# Patient Record
Sex: Female | Born: 1980 | Race: Black or African American | Hispanic: No | Marital: Single | Smoking: Never smoker
Health system: Southern US, Community
[De-identification: ages and names within clinical notes are randomized; demographics above are authoritative.]

## PROBLEM LIST (undated history)

## (undated) DIAGNOSIS — I10 Essential (primary) hypertension: Secondary | ICD-10-CM

## (undated) DIAGNOSIS — R011 Cardiac murmur, unspecified: Secondary | ICD-10-CM

## (undated) DIAGNOSIS — D219 Benign neoplasm of connective and other soft tissue, unspecified: Secondary | ICD-10-CM

## (undated) HISTORY — PX: NO PAST SURGERIES: SHX2092

## (undated) HISTORY — DX: Morbid (severe) obesity due to excess calories: E66.01

## (undated) HISTORY — DX: Cardiac murmur, unspecified: R01.1

## (undated) HISTORY — DX: Essential (primary) hypertension: I10

## (undated) HISTORY — DX: Benign neoplasm of connective and other soft tissue, unspecified: D21.9

---

## 2019-10-22 ENCOUNTER — Other Ambulatory Visit: Payer: Self-pay

## 2019-10-22 ENCOUNTER — Encounter: Payer: Self-pay | Admitting: Nurse Practitioner

## 2019-10-22 ENCOUNTER — Ambulatory Visit: Payer: 59 | Admitting: Nurse Practitioner

## 2019-10-22 VITALS — BP 142/100 | HR 86 | Temp 97.3°F | Ht 65.0 in | Wt 276.0 lb

## 2019-10-22 DIAGNOSIS — Z0001 Encounter for general adult medical examination with abnormal findings: Secondary | ICD-10-CM | POA: Diagnosis not present

## 2019-10-22 DIAGNOSIS — I10 Essential (primary) hypertension: Secondary | ICD-10-CM | POA: Diagnosis not present

## 2019-10-22 DIAGNOSIS — F339 Major depressive disorder, recurrent, unspecified: Secondary | ICD-10-CM | POA: Insufficient documentation

## 2019-10-22 MED ORDER — CITALOPRAM HYDROBROMIDE 10 MG PO TABS: 10.0000 mg | ORAL_TABLET | Freq: Every day | ORAL | 0 refills | Status: DC

## 2019-10-22 MED ORDER — METOPROLOL-HYDROCHLOROTHIAZIDE 50-25 MG PO TABS: 1.0000 | ORAL_TABLET | Freq: Every day | ORAL | 0 refills | Status: DC

## 2019-10-22 NOTE — Patient Instructions (Signed)
Preventive Care 49-38 Years Old, Female Preventive care refers to visits with your health care provider and lifestyle choices that can promote health and wellness. This includes:  A yearly physical exam. This may also be called an annual well check.  Regular dental visits and eye exams.  Immunizations.  Screening for certain conditions.  Healthy lifestyle choices, such as eating a healthy diet, getting regular exercise, not using drugs or products that contain nicotine and tobacco, and limiting alcohol use. What can I expect for my preventive care visit? Physical exam Your health care provider will check your:  Height and weight. This may be used to calculate body mass index (BMI), which tells if you are at a healthy weight.  Heart rate and blood pressure.  Skin for abnormal spots. Counseling Your health care provider may ask you questions about your:  Alcohol, tobacco, and drug use.  Emotional well-being.  Home and relationship well-being.  Sexual activity.  Eating habits.  Work and work Statistician.  Method of birth control.  Menstrual cycle.  Pregnancy history. What immunizations do I need?  Influenza (flu) vaccine  This is recommended every year. Tetanus, diphtheria, and pertussis (Tdap) vaccine  You may need a Td booster every 10 years. Varicella (chickenpox) vaccine  You may need this if you have not been vaccinated. Human papillomavirus (HPV) vaccine  If recommended by your health care provider, you may need three doses over 6 months. Measles, mumps, and rubella (MMR) vaccine  You may need at least one dose of MMR. You may also need a second dose. Meningococcal conjugate (MenACWY) vaccine  One dose is recommended if you are age 75-21 years and a first-year college student living in a residence hall, or if you have one of several medical conditions. You may also need additional booster doses. Pneumococcal conjugate (PCV13) vaccine  You  may need this if you have certain conditions and were not previously vaccinated. Pneumococcal polysaccharide (PPSV23) vaccine  You may need one or two doses if you smoke cigarettes or if you have certain conditions. Hepatitis A vaccine  You may need this if you have certain conditions or if you travel or work in places where you may be exposed to hepatitis A. Hepatitis B vaccine  You may need this if you have certain conditions or if you travel or work in places where you may be exposed to hepatitis B. Haemophilus influenzae type b (Hib) vaccine  You may need this if you have certain conditions. You may receive vaccines as individual doses or as more than one vaccine together in one shot (combination vaccines). Talk with your health care provider about the risks and benefits of combination vaccines. What tests do I need?  Blood tests  Lipid and cholesterol levels. These may be checked every 5 years starting at age 68.  Hepatitis C test.  Hepatitis B test. Screening  Diabetes screening. This is done by checking your blood sugar (glucose) after you have not eaten for a while (fasting).  Sexually transmitted disease (STD) testing.  BRCA-related cancer screening. This may be done if you have a family history of breast, ovarian, tubal, or peritoneal cancers.  Pelvic exam and Pap test. This may be done every 3 years starting at age 90. Starting at age 24, this may be done every 5 years if you have a Pap test in combination with an HPV test. Talk with your health care provider about your test results, treatment options, and if necessary,  the need for more tests. Follow these instructions at home: Eating and drinking   Eat a diet that includes fresh fruits and vegetables, whole grains, lean protein, and low-fat dairy.  Take vitamin and mineral supplements as recommended by your health care provider.  Do not drink alcohol if: ? Your health care provider tells you not to drink. ? You  are pregnant, may be pregnant, or are planning to become pregnant.  If you drink alcohol: ? Limit how much you have to 0-1 drink a day. ? Be aware of how much alcohol is in your drink. In the [place] one drink equals one 12 oz bottle of beer (355 mL), one 5 oz glass of wine (148 mL), or one 1 oz glass of hard liquor (44 mL). Lifestyle  Take daily care of your teeth and gums.  Stay active. Exercise for at least 30 minutes on 5 or more days each week.  Do not use any products that contain nicotine or tobacco, such as cigarettes, e-cigarettes, and chewing tobacco. If you need help quitting, ask your health care provider.  If you are sexually active, practice safe sex. Use a condom or other form of birth control (contraception) in order to prevent pregnancy and STIs (sexually transmitted infections). If you plan to become pregnant, see your health care provider for a preconception visit. What's next?  Visit your health care provider once a year for a well check visit.  Ask your health care provider how often you should have your eyes and teeth checked.  Stay up to date on all vaccines. This information is not intended to replace advice given to you by your health care provider. Make sure you discuss any questions you have with your health care provider. Document Revised: 04/03/2018 Document Reviewed: 04/03/2018 Elsevier Patient Education  [place]. Hypertension, Adult Hypertension is another name for high blood pressure. High blood pressure forces your heart to work harder to pump blood. This can cause problems over time. There are two numbers in a blood pressure reading. There is a top number (systolic) over a bottom number (diastolic). It is best to have a blood pressure that is below 120/80. Healthy choices can help lower your blood pressure, or you may need medicine to help lower it. What are the causes? The cause of this condition is not known. Some conditions may be related to  high blood pressure. What increases the risk?  Smoking.  Having type 2 diabetes mellitus, high cholesterol, or both.  Not getting enough exercise or physical activity.  Being overweight.  Having too much fat, sugar, calories, or salt (sodium) in your diet.  Drinking too much alcohol.  Having long-term (chronic) kidney disease.  Having a family history of high blood pressure.  Age. Risk increases with age.  Race. You may be at higher risk if you are African American.  Gender. Men are at higher risk than women before age 38. After age 91, women are at higher risk than men.  Having obstructive sleep apnea.  Stress. What are the signs or symptoms?  High blood pressure may not cause symptoms. Very high blood pressure (hypertensive crisis) may cause: ? Headache. ? Feelings of worry or nervousness (anxiety). ? Shortness of breath. ? Nosebleed. ? A feeling of being sick to your stomach (nausea). ? Throwing up (vomiting). ? Changes in how you see. ? Very bad chest pain. ? Seizures. How is this treated?  This condition is treated by making healthy lifestyle changes, such as: ?  Eating healthy foods. ? Exercising more. ? Drinking less alcohol.  Your health care provider may prescribe medicine if lifestyle changes are not enough to get your blood pressure under control, and if: ? Your top number is above 130. ? Your bottom number is above 80.  Your personal target blood pressure may vary. Follow these instructions at home: Eating and drinking   If told, follow the DASH eating plan. To follow this plan: ? Fill one half of your plate at each meal with fruits and vegetables. ? Fill one fourth of your plate at each meal with whole grains. Whole grains include whole-wheat pasta, brown rice, and whole-grain bread. ? Eat or drink low-fat dairy products, such as skim milk or low-fat yogurt. ? Fill one fourth of your plate at each meal with low-fat (lean) proteins. Low-fat  proteins include fish, chicken without skin, eggs, beans, and tofu. ? Avoid fatty meat, cured and processed meat, or chicken with skin. ? Avoid pre-made or processed food.  Eat less than 1,500 mg of salt each day.  Do not drink alcohol if: ? Your doctor tells you not to drink. ? You are pregnant, may be pregnant, or are planning to become pregnant.  If you drink alcohol: ? Limit how much you use to:  0-1 drink a day for women.  0-2 drinks a day for men. ? Be aware of how much alcohol is in your drink. In the [place] one drink equals one 12 oz bottle of beer (355 mL), one 5 oz glass of wine (148 mL), or one 1 oz glass of hard liquor (44 mL). Lifestyle   Work with your doctor to stay at a healthy weight or to lose weight. Ask your doctor what the best weight is for you.  Get at least 30 minutes of exercise most days of the week. This may include walking, swimming, or biking.  Get at least 30 minutes of exercise that strengthens your muscles (resistance exercise) at least 3 days a week. This may include lifting weights or doing Pilates.  Do not use any products that contain nicotine or tobacco, such as cigarettes, e-cigarettes, and chewing tobacco. If you need help quitting, ask your doctor.  Check your blood pressure at home as told by your doctor.  Keep all follow-up visits as told by your doctor. This is important. Medicines  Take over-the-counter and prescription medicines only as told by your doctor. Follow directions carefully.  Do not skip doses of blood pressure medicine. The medicine does not work as well if you skip doses. Skipping doses also puts you at risk for problems.  Ask your doctor about side effects or reactions to medicines that you should watch for. Contact a doctor if you:  Think you are having a reaction to the medicine you are taking.  Have headaches that keep coming back (recurring).  Feel dizzy.  Have swelling in your ankles.  Have trouble with  your vision. Get help right away if you:  Get a very bad headache.  Start to feel mixed up (confused).  Feel weak or numb.  Feel faint.  Have very bad pain in your: ? Chest. ? Belly (abdomen).  Throw up more than once.  Have trouble breathing. Summary  Hypertension is another name for high blood pressure.  High blood pressure forces your heart to work harder to pump blood.  For most people, a normal blood pressure is less than 120/80.  Making healthy choices can help lower blood pressure. If  your blood pressure does not get lower with healthy choices, you may need to take medicine. This information is not intended to replace advice given to you by your health care provider. Make sure you discuss any questions you have with your health care provider. Document Revised: 04/02/2018 Document Reviewed: 04/02/2018 Elsevier Patient Education  2020 [employer].

## 2019-10-22 NOTE — Progress Notes (Signed)
Established Patient Office Visit  Subjective:  Patient ID: Rebekah Barnes, female    DOB: Dec 31, 1980  Age: 39 y.o. MRN: [id number]  CC: Patient  is a 39 year  old female. She is here for complete physical.  The patient's medications were reviewed and reconciled since the patient's last visit.  History details were provided by the patient. The history appears to be reliable.    Chief Complaint  Patient presents with  . Annual Exam  . Hypertension    Patient states she has been without medication for a year  . Depression    Patient would like to restart something other  than Celexa due to it causing her to gain weight    HPI Rebekah Barnes presents for complete physical Encounter for general adult medical examination without abnormal findings   Patient's last physical exam was 1 year ago .  Weight: Appropriate for height (BMI 45.93kg/m) ;  Blood Pressure: abnormal (BP greater than 120/80) ;  Medical History: Patient history reviewed ; Family history reviewed ;  Allergies Reviewed: No change in current allergies ;  Medications Reviewed: Medications reviewed - no changes ;  Lipids: Normal lipid levels ;  Smoking: Life-long non-smoker ;  Physical Activity: Exercises at least 2 times per week ;  Alcohol/Drug Use: Is a non-drinker ; No illicit drug use ;  Patient is not afflicted from Stress Incontinence and Urge Incontinence  Safety: reviewed ; Patient wears a seat belt, has smoke detectors, has carbon monoxide detectors, practices appropriate gun safety (not applicable), and wears sunscreen with extended sun exposure. Dental Care: biannual cleanings, brushes and flosses daily. Ophthalmology/Optometry: Annual visit.  Hearing loss: none Vision impairments: none  Past Medical History:  Diagnosis Date  . Essential (primary) hypertension   . Morbid (severe) obesity due to excess calories [facility])     History reviewed. No pertinent surgical history.  Family History  Problem Relation  Age of Onset  . Hypertension Father   . Renal Disease Father     Social History   Socioeconomic History  . Marital status: Single    Spouse name: Not on file  . Number of children: Not on file  . Years of education: Not on file  . Highest education level: Not on file  Occupational History  . Not on file  Tobacco Use  . Smoking status: Never Smoker  . Smokeless tobacco: Never Used  Substance and Sexual Activity  . Alcohol use: Not Currently  . Drug use: Never  . Sexual activity: Not on file  Other Topics Concern  . Not on file  Social History Narrative  . Not on file   Social Determinants of Health   Financial Resource Strain:   . Difficulty of Paying Living Expenses:   Food Insecurity:   . Worried About Charity fundraiser in the Last Year:   . Arboriculturist in the Last Year:   Transportation Needs:   . Film/video editor (Medical):   Marland Kitchen Lack of Transportation (Non-Medical):   Physical Activity:   . Days of Exercise per Week:   . Minutes of Exercise per Session:   Stress:   . Feeling of Stress :   Social Connections:   . Frequency of Communication with Friends and Family:   . Frequency of Social Gatherings with Friends and Family:   . Attends Religious Services:   . Active Member of Clubs or Organizations:   . Attends Archivist Meetings:   Marland Kitchen Marital  Status:   Intimate Partner Violence:   . Fear of Current or Ex-Partner:   . Emotionally Abused:   Marland Kitchen Physically Abused:   . Sexually Abused:     Outpatient Medications Prior to Visit  Medication Sig Dispense Refill  . Multiple Vitamin (MULTIVITAMIN) tablet Take 1 tablet by mouth daily.    . citalopram (CELEXA) 20 MG tablet Take 20 mg by mouth daily.    . metoprolol-hydrochlorothiazide (LOPRESSOR HCT) 50-25 MG tablet Take 1 tablet by mouth daily.    . phentermine (ADIPEX-P) 37.5 MG tablet Take 37.5 mg by mouth daily before breakfast.     No facility-administered medications prior to visit.     Allergies  Allergen Reactions  . Lisinopril Other (See Comments)    Angioedema     ROS Review of Systems  Constitutional: Negative for activity change, appetite change and fatigue.  HENT: Negative for congestion and ear pain.   Eyes: Negative for pain and discharge.  Respiratory: Negative for chest tightness and shortness of breath.   Cardiovascular: Negative for chest pain and palpitations.  Gastrointestinal: Negative for abdominal pain and constipation.  Endocrine: Negative for cold intolerance and heat intolerance.  Genitourinary: Negative for difficulty urinating and dysuria.  Musculoskeletal: Negative for arthralgias and myalgias.  Skin: Negative for color change and rash.  Neurological: Negative for weakness and headaches.  Psychiatric/Behavioral: The patient is not nervous/anxious.       Objective:    Physical Exam  Constitutional: She is oriented to person, place, and time. She appears well-developed and well-nourished.  HENT:  Head: Normocephalic.  Mouth/Throat: Oropharynx is clear and moist.  Eyes: Right eye exhibits no discharge. Left eye exhibits no discharge.  Cardiovascular: Normal rate and regular rhythm.  Pulmonary/Chest: Effort normal and breath sounds normal.  Abdominal: Soft. Bowel sounds are normal.  Musculoskeletal:        General: No edema.     Cervical back: Normal range of motion and neck supple.  Neurological: She is alert and oriented to person, place, and time.  Skin: Skin is warm. No rash noted.  Psychiatric: She has a normal mood and affect. Judgment normal.     BP (!) 142/100 (BP Location: Left Arm, Patient Position: Sitting)   Pulse 86   Temp (!) 97.3 F (36.3 C)   Ht 5' 5"  (1.651 m)   Wt 276 lb (125.2 kg)   SpO2 98%   BMI 45.93 kg/m  Wt Readings from Last 3 Encounters:  10/22/19 276 lb (125.2 kg)       Health Maintenance Due  Topic Date Due  . HIV Screening  Never done  . TETANUS/TDAP  Never done  . PAP SMEAR-Modifier   Never done  . INFLUENZA VACCINE  03/07/2019    There are no preventive care reminders to display for this patient.  No results found for: TSH No results found for: WBC, HGB, HCT, MCV, PLT No results found for: NA, K, CHLORIDE, CO2, GLUCOSE, BUN, CREATININE, BILITOT, ALKPHOS, AST, ALT, PROT, ALBUMIN, CALCIUM, ANIONGAP, EGFR, GFR No results found for: CHOL No results found for: HDL No results found for: LDLCALC No results found for: TRIG No results found for: CHOLHDL No results found for: HGBA1C    Assessment & Plan:  Depression, recurrent ([place]) Not well controlled.  Started back on medication.    Annual visit for general adult medical examination with abnormal findings Well controlled.  No changes to medicines.  Continue to work on eating a healthy diet and exercise.  Labs  drawn today.   Essential hypertension Not well controlled.  Changes to medication, Rx sent to pharmacy. Continue to work on eating a low sodium diet. and exercise.    Problem List Items Addressed This Visit      Cardiovascular and Mediastinum   Essential hypertension    Well controlled.  No changes to medicines.  Continue to work on eating a healthy diet and exercise.  Labs drawn today.       Relevant Medications   metoprolol-hydrochlorothiazide (LOPRESSOR HCT) 50-25 MG tablet     Other   Annual visit for general adult medical examination with abnormal findings - Primary    Well controlled.  No changes to medicines.  Continue to work on eating a healthy diet and exercise.  Labs drawn today.       Relevant Orders   CBC with Differential/Platelet   Comprehensive metabolic panel   ATV,VLRT,JWWZLYTSS,QSY71/58,06   Depression, recurrent ([place])    Well controlled.  No changes to medicines.  Continue to work on eating a healthy diet and exercise.  Labs drawn today.       Relevant Medications   citalopram (CELEXA) 10 MG tablet      Meds ordered this encounter  Medications  .  metoprolol-hydrochlorothiazide (LOPRESSOR HCT) 50-25 MG tablet    Sig: Take 1 tablet by mouth daily.    Dispense:  30 tablet    Refill:  0    Order Specific Question:   Supervising Provider    Answer:   Bo Merino [[id number]]  . citalopram (CELEXA) 10 MG tablet    Sig: Take 1 tablet (10 mg total) by mouth daily.    Dispense:  45 tablet    Refill:  0    Order Specific Question:   Supervising Provider    Answer:   Bo Merino [[id number]]    Follow-up: Return in about 2 weeks (around 11/05/2019) for blood pressure follow up.    Ivy Lynn, NP

## 2019-10-22 NOTE — Assessment & Plan Note (Signed)
Well controlled.  ?No changes to medicines.  ?Continue to work on eating a healthy diet and exercise.  ?Labs drawn today.  ?

## 2019-10-22 NOTE — Assessment & Plan Note (Addendum)
Not well controlled.  Changes to medication, Rx sent to pharmacy. Continue to work on eating a low sodium diet. and exercise.

## 2019-10-22 NOTE — Assessment & Plan Note (Addendum)
Not well controlled.  Started back on medication.

## 2019-10-23 LAB — COMPREHENSIVE METABOLIC PANEL
ALT: 13 IU/L (ref 0–32)
AST: 20 IU/L (ref 0–40)
Albumin/Globulin Ratio: 1.2 (ref 1.2–2.2)
Albumin: 4.1 g/dL (ref 3.8–4.8)
Alkaline Phosphatase: 105 IU/L (ref 39–117)
BUN/Creatinine Ratio: 15 (ref 9–23)
BUN: 15 mg/dL (ref 6–20)
Bilirubin Total: <0.2 mg/dL (ref 0.0–1.2)
CO2: 23 mmol/L (ref 20–29)
Calcium: 9.3 mg/dL (ref 8.7–10.2)
Chloride: 104 mmol/L (ref 96–106)
Creatinine, Ser: 0.97 mg/dL (ref 0.57–1.00)
GFR calc Af Amer: 86 mL/min/{1.73_m2} (ref >59)
GFR calc non Af Amer: 75 mL/min/{1.73_m2} (ref >59)
Globulin, Total: 3.4 g/dL (ref 1.5–4.5)
Glucose: 92 mg/dL (ref 65–99)
Potassium: 4.9 mmol/L (ref 3.5–5.2)
Sodium: 141 mmol/L (ref 134–144)
Total Protein: 7.5 g/dL (ref 6.0–8.5)

## 2019-10-23 LAB — CBC WITH DIFFERENTIAL/PLATELET
Basophils Absolute: 0.1 10*3/uL (ref 0.0–0.2)
Basos: 1 %
EOS (ABSOLUTE): 0.4 10*3/uL (ref 0.0–0.4)
Eos: 3 %
Hematocrit: 36.4 % (ref 34.0–46.6)
Hemoglobin: 11.7 g/dL (ref 11.1–15.9)
Immature Grans (Abs): 0 10*3/uL (ref 0.0–0.1)
Immature Granulocytes: 0 %
Lymphocytes Absolute: 2.8 10*3/uL (ref 0.7–3.1)
Lymphs: 22 %
MCH: 27.1 pg (ref 26.6–33.0)
MCHC: 32.1 g/dL (ref 31.5–35.7)
MCV: 85 fL (ref 79–97)
Monocytes Absolute: 1 10*3/uL — ABNORMAL HIGH (ref 0.1–0.9)
Monocytes: 8 %
Neutrophils Absolute: 8.5 10*3/uL — ABNORMAL HIGH (ref 1.4–7.0)
Neutrophils: 66 %
Platelets: 415 10*3/uL (ref 150–450)
RBC: 4.31 x10E6/uL (ref 3.77–5.28)
RDW: 13.7 % (ref 11.7–15.4)
WBC: 12.8 10*3/uL — ABNORMAL HIGH (ref 3.4–10.8)

## 2019-10-28 LAB — IGP,CTNG,APTIMAHPV,RFX16/18,45
Chlamydia, Nuc. Acid Amp: NEGATIVE
Gonococcus by Nucleic Acid Amp: NEGATIVE
HPV Aptima: NEGATIVE
PAP Smear Comment: 0

## 2019-11-05 ENCOUNTER — Ambulatory Visit: Payer: 59 | Admitting: Nurse Practitioner

## 2019-11-05 ENCOUNTER — Other Ambulatory Visit: Payer: Self-pay

## 2019-11-05 ENCOUNTER — Encounter: Payer: Self-pay | Admitting: Nurse Practitioner

## 2019-11-05 VITALS — BP 138/90 | HR 72 | Temp 97.2°F | Ht 65.0 in | Wt 276.0 lb

## 2019-11-05 DIAGNOSIS — I10 Essential (primary) hypertension: Secondary | ICD-10-CM | POA: Diagnosis not present

## 2019-11-05 DIAGNOSIS — F339 Major depressive disorder, recurrent, unspecified: Secondary | ICD-10-CM

## 2019-11-05 NOTE — Assessment & Plan Note (Signed)
No changes to medication. Blood pressure well managed on current dose. Continue to monitor blood pressure at home and follow up in one month.

## 2019-11-05 NOTE — Progress Notes (Addendum)
Established Patient Office Visit  Subjective:  Patient ID: Rebekah Barnes, female    DOB: 10-10-1980  Age: 39 y.o. MRN: [id number]  CC: Patient  is a 39 year old female. She is here for hypertension.  The patient's medications were reviewed and reconciled since the patient's last visit.  History details were provided by the patient. The history appears to be reliable.   Chief Complaint  Patient presents with  . Hypertension    2 week follow up    HPI Rebekah Barnes presents for Pt presents for follow up of hypertension. Patient was diagnosed in 2018. Current medication lopressor HCT 50-25 The patient is tolerating the medication well without side effects. Compliance with treatment has been good; including taking medication as directed , maintains a healthy diet and regular exercise regimen , and following up as directed.  concerning patients depression, she is well managed on current medication. Patient deinies any suicidal thought of harming self and others.  Past Medical History:  Diagnosis Date  . Essential (primary) hypertension   . Morbid (severe) obesity due to excess calories [facility])     History reviewed. No pertinent surgical history.  Family History  Problem Relation Age of Onset  . Hypertension Father   . Renal Disease Father     Social History   Socioeconomic History  . Marital status: Single    Spouse name: Not on file  . Number of children: Not on file  . Years of education: Not on file  . Highest education level: Not on file  Occupational History  . Not on file  Tobacco Use  . Smoking status: Never Smoker  . Smokeless tobacco: Never Used  Substance and Sexual Activity  . Alcohol use: Not Currently  . Drug use: Never  . Sexual activity: Not on file  Other Topics Concern  . Not on file  Social History Narrative  . Not on file   Social Determinants of Health   Financial Resource Strain:   . Difficulty of Paying Living Expenses:   Food Insecurity:     . Worried About Charity fundraiser in the Last Year:   . Arboriculturist in the Last Year:   Transportation Needs:   . Film/video editor (Medical):   Marland Kitchen Lack of Transportation (Non-Medical):   Physical Activity:   . Days of Exercise per Week:   . Minutes of Exercise per Session:   Stress:   . Feeling of Stress :   Social Connections:   . Frequency of Communication with Friends and Family:   . Frequency of Social Gatherings with Friends and Family:   . Attends Religious Services:   . Active Member of Clubs or Organizations:   . Attends Archivist Meetings:   Marland Kitchen Marital Status:   Intimate Partner Violence:   . Fear of Current or Ex-Partner:   . Emotionally Abused:   Marland Kitchen Physically Abused:   . Sexually Abused:     Outpatient Medications Prior to Visit  Medication Sig Dispense Refill  . citalopram (CELEXA) 10 MG tablet Take 1 tablet (10 mg total) by mouth daily. 45 tablet 0  . metoprolol-hydrochlorothiazide (LOPRESSOR HCT) 50-25 MG tablet Take 1 tablet by mouth daily. 30 tablet 0  . Multiple Vitamin (MULTIVITAMIN) tablet Take 1 tablet by mouth daily.     No facility-administered medications prior to visit.    Allergies  Allergen Reactions  . Lisinopril Other (See Comments)    Angioedema  ROS Review of Systems  Eyes: Negative for redness.  Respiratory: Negative for chest tightness and shortness of breath.   Cardiovascular: Negative for chest pain and palpitations.  Endocrine: Negative for cold intolerance and heat intolerance.  Genitourinary: Negative for difficulty urinating.  Musculoskeletal: Negative for arthralgias and myalgias.  Skin: Negative for color change and rash.  Neurological: Negative for weakness and light-headedness.  Psychiatric/Behavioral: The patient is not nervous/anxious.       Objective:    Physical Exam  Constitutional: She is oriented to person, place, and time. She appears well-developed and well-nourished.  HENT:   Mouth/Throat: Oropharynx is clear and moist.  Eyes: Conjunctivae are normal.  Cardiovascular: Normal rate, regular rhythm and normal heart sounds.  Pulmonary/Chest: Effort normal and breath sounds normal.  Abdominal: Soft. Bowel sounds are normal.  Genitourinary:    Vaginal discharge present.   Musculoskeletal:        General: No tenderness.     Cervical back: Neck supple.  Neurological: She is alert and oriented to person, place, and time.  Skin: Skin is warm. No rash noted.  Psychiatric: She has a normal mood and affect.    BP 138/90 (BP Location: Left Arm, Patient Position: Sitting)   Pulse 72   Temp (!) 97.2 F (36.2 C) (Temporal)   Ht 5\' 5"  (1.651 m)   Wt 276 lb (125.2 kg)   SpO2 99%   BMI 45.93 kg/m  Wt Readings from Last 3 Encounters:  11/05/19 276 lb (125.2 kg)  10/22/19 276 lb (125.2 kg)     Health Maintenance Due  Topic Date Due  . HIV Screening  Never done  . TETANUS/TDAP  Never done    There are no preventive care reminders to display for this patient.  No results found for: TSH Lab Results  Component Value Date   WBC 12.8 (H) 10/22/2019   HGB 11.7 10/22/2019   HCT 36.4 10/22/2019   MCV 85 10/22/2019   PLT 415 10/22/2019   Lab Results  Component Value Date   NA 141 10/22/2019   K 4.9 10/22/2019   CO2 23 10/22/2019   GLUCOSE 92 10/22/2019   BUN 15 10/22/2019   CREATININE 0.97 10/22/2019   BILITOT <0.2 10/22/2019   ALKPHOS 105 10/22/2019   AST 20 10/22/2019   ALT 13 10/22/2019   PROT 7.5 10/22/2019   ALBUMIN 4.1 10/22/2019   CALCIUM 9.3 10/22/2019   No results found for: CHOL No results found for: HDL No results found for: LDLCALC No results found for: TRIG No results found for: CHOLHDL No results found for: HGBA1C    Assessment & Plan:  Essential hypertension No changes to medication. Blood pressure well managed on current dose. Continue to monitor blood pressure at home and follow up in one month.  Depression, recurrent  ([place]) Patient is well controlled on current medication. Patient knows to follow up as schedulled.    Problem List Items Addressed This Visit      Cardiovascular and Mediastinum   Essential hypertension     Other   Depression, recurrent ([place]) - Primary      No orders of the defined types were placed in this encounter.   Follow-up: Return in about 1 month (around 12/05/2019) for BP.    Ivy Lynn, NP

## 2019-11-05 NOTE — Assessment & Plan Note (Signed)
Patient is well controlled on current medication. Patient knows to follow up as schedulled.

## 2019-11-05 NOTE — Patient Instructions (Signed)

## 2019-11-08 ENCOUNTER — Other Ambulatory Visit (INDEPENDENT_AMBULATORY_CARE_PROVIDER_SITE_OTHER): Payer: 59 | Admitting: Nurse Practitioner

## 2019-11-08 DIAGNOSIS — A599 Trichomoniasis, unspecified: Secondary | ICD-10-CM

## 2019-11-08 MED ORDER — METRONIDAZOLE 500 MG PO TABS: 500.0000 mg | ORAL_TABLET | Freq: Two times a day (BID) | ORAL | 0 refills | Status: DC

## 2019-11-08 NOTE — Progress Notes (Signed)
Abnormal Labs: Positive Trichomoniasis;  Recommendation: 500 mg (metronidizole) twice daily for 7 days  sent to pharmacy. Patient should avoid alcohol during treatment and 24 hours after medication administration. Avoid sex until patient  and partner have been treated, follow up repeat testing in 2-3 months  patients partner needs  to be treated at the same time.

## 2019-11-09 NOTE — Addendum Note (Signed)
Addended by: Ivy Lynn on: 11/09/2019 08:45 AM   Modules accepted: Level of Service

## 2019-11-10 ENCOUNTER — Other Ambulatory Visit: Payer: Self-pay

## 2019-11-10 DIAGNOSIS — I10 Essential (primary) hypertension: Secondary | ICD-10-CM

## 2019-11-10 MED ORDER — METOPROLOL-HYDROCHLOROTHIAZIDE 50-25 MG PO TABS: 1.0000 | ORAL_TABLET | Freq: Every day | ORAL | 0 refills | Status: DC

## 2019-12-07 ENCOUNTER — Ambulatory Visit: Payer: 59 | Admitting: Nurse Practitioner

## 2019-12-07 NOTE — Progress Notes (Signed)
Subjective:  Patient ID: Rebekah Barnes, female    DOB: 12-15-1980  Age: 39 y.o. MRN: [id number]  Chief Complaint  Patient presents with  . Hypertension    HPI Patient is a 39 year old female who presents for follow-up of depression, hypertension and obesity.  Currently her depression is well controlled on citalopram 20 mg once daily.  Her blood pressure has significantly improved since her initial appointment early March.  She is currently on metoprolol/HCTZ 50-25 mg once daily.  She denies any chest pain, blurry vision, or headaches.  Patient also suffers from morbid obesity.  BMI is 45.  She has intermittently been on Adipex since 2017.  It does not appear that she is ever taken it for the full 6 months.  She has had some success.  However she has difficulty eating healthy and exercising.  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  Drinking a lot of water and has decreased her soda intake significantly.  She is requesting to restart the phentermine.  Patient is complaining of dysmenorrhea.  She has menstrual periods monthly which range from light to heavy.  She has cramping 2 to 3 days before her period starts.  She occasionally takes ibuprofen 400 mg which does seem to help.  Her periods last 4 to 5 days.  Previously when she was on oral birth control pills it did not seem to help the pain.  Currently she uses 1 tampon approximately every 2-3 hours.   Past Medical History:  Diagnosis Date  . Essential (primary) hypertension   . Morbid (severe) obesity due to excess calories [facility])    History reviewed. No pertinent surgical history.  Family History  Problem Relation Age of Onset  . Hypertension Father   . Renal Disease Father    Social History   Socioeconomic History  . Marital status: Single    Spouse name: Not on file  . Number of children: Not on file  . Years of education: Not on file  . Highest education level: Not on file  Occupational  History  . Not on file  Tobacco Use  . Smoking status: Never Smoker  . Smokeless tobacco: Never Used  Substance and Sexual Activity  . Alcohol use: Not Currently  . Drug use: Never  . Sexual activity: Not on file  Other Topics Concern  . Not on file  Social History Narrative  . Not on file   Social Determinants of Health   Financial Resource Strain:   . Difficulty of Paying Living Expenses:   Food Insecurity:   . Worried About Charity fundraiser in the Last Year:   . Arboriculturist in the Last Year:   Transportation Needs:   . Film/video editor (Medical):   Marland Kitchen Lack of Transportation (Non-Medical):   Physical Activity:   . Days of Exercise per Week:   . Minutes of Exercise per Session:   Stress:   . Feeling of Stress :   Social Connections:   . Frequency of Communication with Friends and Family:   . Frequency of Social Gatherings with Friends and Family:   . Attends Religious Services:   . Active Member of Clubs or Organizations:   . Attends Archivist Meetings:   Marland Kitchen Marital Status:     Review of Systems  Constitutional: Negative for chills, fatigue and fever.  HENT: Negative for congestion, ear pain, rhinorrhea and sore throat.  Respiratory: Negative for cough and shortness of breath.   Cardiovascular: Negative for chest pain.  Gastrointestinal: Negative for abdominal pain, constipation, diarrhea, nausea and vomiting.  Genitourinary: Negative for dysuria and urgency.  Musculoskeletal: Negative for back pain and myalgias.  Neurological: Negative for dizziness, weakness, light-headedness and headaches (sometimes).  Psychiatric/Behavioral: Positive for dysphoric mood. The patient is not nervous/anxious.      Objective:  BP 134/80   Pulse 63   Temp (!) 97.4 F (36.3 C)   Ht 5\' 5"  (1.651 m)   Wt 273 lb (123.8 kg)   LMP 12/07/2019   SpO2 97%   BMI 45.43 kg/m   BP/Weight 12/08/2019 11/05/2019 [id number]  Systolic BP [id number]  Diastolic BP 80 90  [id number]  Wt. (Lbs) 273 276 276  BMI 45.43 45.93 45.93    Physical Exam Vitals reviewed.  Constitutional:      Appearance: Normal appearance. She is obese.  Cardiovascular:     Rate and Rhythm: Normal rate and regular rhythm.     Pulses: Normal pulses.     Heart sounds: Normal heart sounds.  Pulmonary:     Effort: Pulmonary effort is normal. No respiratory distress.     Breath sounds: Normal breath sounds.  Abdominal:     General: Abdomen is flat. Bowel sounds are normal.     Palpations: Abdomen is soft.     Tenderness: There is no abdominal tenderness.  Neurological:     Mental Status: She is alert and oriented to person, place, and time.  Psychiatric:        Mood and Affect: Mood normal.        Behavior: Behavior normal.     Lab Results  Component Value Date   WBC 12.8 (H) 10/22/2019   HGB 11.7 10/22/2019   HCT 36.4 10/22/2019   PLT 415 10/22/2019   GLUCOSE 92 10/22/2019   ALT 13 10/22/2019   AST 20 10/22/2019   NA 141 10/22/2019   K 4.9 10/22/2019   CL 104 10/22/2019   CREATININE 0.97 10/22/2019   BUN 15 10/22/2019   CO2 23 10/22/2019      Assessment & Plan:  1. Essential hypertension Well controlled.  No changes to medicines.  Continue to work on eating a healthy diet and exercise.  - metoprolol-hydrochlorothiazide (LOPRESSOR HCT) 50-25 MG tablet; Take 1 tablet by mouth daily.  Dispense: 30 tablet; Refill: 0  2. Mild recurrent major depression (H[facility]) The current medical regimen is effective;  continue present plan and medications. - citalopram (CELEXA) 10 MG tablet; Take 1 tablet (10 mg total) by mouth daily.  Dispense: 45 tablet; Refill: 0  3. BMI 45.0-49.9, adult ([place]) See below.  4. Morbid obesity ([place]) -Diet given.  Discussed importance of portion control.  Discussed not skipping meals.  Increase exercise. -Monitor blood pressure.  If blood pressure begins to go up the patient should stop the phentermine.  Normally would have her follow-up in 1 to 2  months, however since the patient has tolerated medicine in the past I did give her 3 months worth.  Initially when I saw the patient I thought she had been on it more recently.  We will leave her follow-up in 3 months for now  5. Dysmenorrhea - pt to start naproxen 500 mg twice daily 1 to 2 days prior to her if possible. She should take this at least for the first 3 days if not the entire.  Meds ordered this encounter  Medications  .  metoprolol-hydrochlorothiazide (LOPRESSOR HCT) 50-25 MG tablet    Sig: Take 1 tablet by mouth daily.    Dispense:  30 tablet    Refill:  0  . naproxen (EC NAPROSYN) 500 MG EC tablet    Sig: Take 1 tablet (500 mg total) by mouth 2 (two) times daily as needed (menstrual cramps.).    Dispense:  60 tablet    Refill:  3  . DISCONTD: citalopram (CELEXA) 10 MG tablet    Sig: Take 1 tablet (10 mg total) by mouth daily.    Dispense:  45 tablet    Refill:  0  . phentermine 37.5 MG capsule    Sig: Take 1 capsule (37.5 mg total) by mouth every morning.    Dispense:  30 capsule    Refill:  2  . citalopram (CELEXA) 10 MG tablet    Sig: Take 1 tablet (10 mg total) by mouth daily.    Dispense:  90 tablet    Refill:  0    If patient has not filled her previous rx for citalopram 10 mg once daily #45/0, please cancel this and given her the #90/0 refill.     Follow-up: Return in about 3 months (around 03/09/2020) for fasting.  An After Visit Summary was printed and given to the patient.  Rochel Brome [facility] [phone]

## 2019-12-08 ENCOUNTER — Other Ambulatory Visit: Payer: Self-pay

## 2019-12-08 ENCOUNTER — Encounter: Payer: Self-pay | Admitting: Family Medicine

## 2019-12-08 ENCOUNTER — Ambulatory Visit: Payer: 59 | Admitting: Family Medicine

## 2019-12-08 VITALS — BP 134/80 | HR 63 | Temp 97.4°F | Ht 65.0 in | Wt 273.0 lb

## 2019-12-08 DIAGNOSIS — F330 Major depressive disorder, recurrent, mild: Secondary | ICD-10-CM

## 2019-12-08 DIAGNOSIS — Z6841 Body Mass Index (BMI) 40.0 and over, adult: Secondary | ICD-10-CM

## 2019-12-08 DIAGNOSIS — F339 Major depressive disorder, recurrent, unspecified: Secondary | ICD-10-CM

## 2019-12-08 DIAGNOSIS — N946 Dysmenorrhea, unspecified: Secondary | ICD-10-CM

## 2019-12-08 DIAGNOSIS — I10 Essential (primary) hypertension: Secondary | ICD-10-CM

## 2019-12-08 MED ORDER — CITALOPRAM HYDROBROMIDE 10 MG PO TABS: 10.0000 mg | ORAL_TABLET | Freq: Every day | ORAL | 0 refills | Status: DC

## 2019-12-08 MED ORDER — PHENTERMINE HCL 37.5 MG PO CAPS
37.5000 mg | ORAL_CAPSULE | ORAL | 2 refills | Status: DC
Start: 2019-12-08 — End: 2020-04-22

## 2019-12-08 MED ORDER — METOPROLOL-HYDROCHLOROTHIAZIDE 50-25 MG PO TABS: 1.0000 | ORAL_TABLET | Freq: Every day | ORAL | 0 refills | Status: DC

## 2019-12-08 MED ORDER — NAPROXEN 500 MG PO TBEC: 500.0000 mg | DELAYED_RELEASE_TABLET | Freq: Two times a day (BID) | ORAL | 3 refills | Status: DC | PRN

## 2019-12-08 NOTE — Patient Instructions (Addendum)
Naproxen for menstrual pain   DASH Eating Plan DASH stands for "Dietary Approaches to Stop Hypertension." The DASH eating plan is a healthy eating plan that has been shown to reduce high blood pressure (hypertension). It may also reduce your risk for type 2 diabetes, heart disease, and stroke. The DASH eating plan may also help with weight loss. What are tips for following this plan?  General guidelines  Avoid eating more than 2,300 mg (milligrams) of salt (sodium) a day. If you have hypertension, you may need to reduce your sodium intake to 1,500 mg a day.  Limit alcohol intake to no more than 1 drink a day for nonpregnant women and 2 drinks a day for men. One drink equals 12 oz of beer, 5 oz of wine, or 1 oz of hard liquor.  Work with your health care provider to maintain a healthy body weight or to lose weight. Ask what an ideal weight is for you.  Get at least 30 minutes of exercise that causes your heart to beat faster (aerobic exercise) most days of the week. Activities may include walking, swimming, or biking.  Work with your health care provider or diet and nutrition specialist (dietitian) to adjust your eating plan to your individual calorie needs. Reading food labels   Check food labels for the amount of sodium per serving. Choose foods with less than 5 percent of the Daily Value of sodium. Generally, foods with less than 300 mg of sodium per serving fit into this eating plan.  To find whole grains, look for the word "whole" as the first word in the ingredient list. Shopping  Buy products labeled as "low-sodium" or "no salt added."  Buy fresh foods. Avoid canned foods and premade or frozen meals. Cooking  Avoid adding salt when cooking. Use salt-free seasonings or herbs instead of table salt or sea salt. Check with your health care provider or pharmacist before using salt substitutes.  Do not fry foods. Cook foods using healthy methods such as baking, boiling, grilling,  and broiling instead.  Cook with heart-healthy oils, such as olive, canola, soybean, or sunflower oil. Meal planning  Eat a balanced diet that includes: ? 5 or more servings of fruits and vegetables each day. At each meal, try to fill half of your plate with fruits and vegetables. ? Up to 6-8 servings of whole grains each day. ? Less than 6 oz of lean meat, poultry, or fish each day. A 3-oz serving of meat is about the same size as a deck of cards. One egg equals 1 oz. ? 2 servings of low-fat dairy each day. ? A serving of nuts, seeds, or beans 5 times each week. ? Heart-healthy fats. Healthy fats called Omega-3 fatty acids are found in foods such as flaxseeds and coldwater fish, like sardines, salmon, and mackerel.  Limit how much you eat of the following: ? Canned or prepackaged foods. ? Food that is high in trans fat, such as fried foods. ? Food that is high in saturated fat, such as fatty meat. ? Sweets, desserts, sugary drinks, and other foods with added sugar. ? Full-fat dairy products.  Do not salt foods before eating.  Try to eat at least 2 vegetarian meals each week.  Eat more home-cooked food and less restaurant, buffet, and fast food.  When eating at a restaurant, ask that your food be prepared with less salt or no salt, if possible. What foods are recommended? The items listed may not be a complete  list. Talk with your dietitian about what dietary choices are best for you. Grains Whole-grain or whole-wheat bread. Whole-grain or whole-wheat pasta. Brown rice. [place]. Bulgur. Whole-grain and low-sodium cereals. Pita bread. Low-fat, low-sodium crackers. Whole-wheat flour tortillas. Vegetables Fresh or frozen vegetables (raw, steamed, roasted, or grilled). Low-sodium or reduced-sodium tomato and vegetable juice. Low-sodium or reduced-sodium tomato sauce and tomato paste. Low-sodium or reduced-sodium canned vegetables. Fruits All fresh, dried, or frozen fruit. Canned  fruit in natural juice (without added sugar). Meat and other protein foods Skinless chicken or [place]. Ground chicken or [place]. Pork with fat trimmed off. Fish and seafood. Egg whites. Dried beans, peas, or lentils. Unsalted nuts, nut butters, and seeds. Unsalted canned beans. Lean cuts of beef with fat trimmed off. Low-sodium, lean deli meat. Dairy Low-fat (1%) or fat-free (skim) milk. Fat-free, low-fat, or reduced-fat cheeses. Nonfat, low-sodium ricotta or cottage cheese. Low-fat or nonfat yogurt. Low-fat, low-sodium cheese. Fats and oils Soft margarine without trans fats. Vegetable oil. Low-fat, reduced-fat, or light mayonnaise and salad dressings (reduced-sodium). Canola, safflower, olive, soybean, and sunflower oils. Avocado. Seasoning and other foods Herbs. Spices. Seasoning mixes without salt. Unsalted popcorn and pretzels. Fat-free sweets. What foods are not recommended? The items listed may not be a complete list. Talk with your dietitian about what dietary choices are best for you. Grains Baked goods made with fat, such as croissants, muffins, or some breads. Dry pasta or rice meal packs. Vegetables Creamed or fried vegetables. Vegetables in a cheese sauce. Regular canned vegetables (not low-sodium or reduced-sodium). Regular canned tomato sauce and paste (not low-sodium or reduced-sodium). Regular tomato and vegetable juice (not low-sodium or reduced-sodium). Angie Fava. Olives. Fruits Canned fruit in a light or heavy syrup. Fried fruit. Fruit in cream or butter sauce. Meat and other protein foods Fatty cuts of meat. Ribs. Fried meat. Berniece Salines. Sausage. Bologna and other processed lunch meats. Salami. Fatback. Hotdogs. Bratwurst. Salted nuts and seeds. Canned beans with added salt. Canned or smoked fish. Whole eggs or egg yolks. Chicken or [place] with skin. Dairy Whole or 2% milk, cream, and half-and-half. Whole or full-fat cream cheese. Whole-fat or sweetened yogurt. Full-fat cheese.  Nondairy creamers. Whipped toppings. Processed cheese and cheese spreads. Fats and oils Butter. Stick margarine. Lard. Shortening. Ghee. Bacon fat. Tropical oils, such as coconut, palm kernel, or palm oil. Seasoning and other foods Salted popcorn and pretzels. Onion salt, garlic salt, seasoned salt, table salt, and sea salt. Worcestershire sauce. Tartar sauce. Barbecue sauce. Teriyaki sauce. Soy sauce, including reduced-sodium. Steak sauce. Canned and packaged gravies. Fish sauce. Oyster sauce. Cocktail sauce. Horseradish that you find on the shelf. Ketchup. Mustard. Meat flavorings and tenderizers. Bouillon cubes. Hot sauce and Tabasco sauce. Premade or packaged marinades. Premade or packaged taco seasonings. Relishes. Regular salad dressings. Where to find more information:  National Heart, Lung, and [place]: https://wilson-eaton.com/  American Heart Association: www.heart.org Summary  The DASH eating plan is a healthy eating plan that has been shown to reduce high blood pressure (hypertension). It may also reduce your risk for type 2 diabetes, heart disease, and stroke.  With the DASH eating plan, you should limit salt (sodium) intake to 2,300 mg a day. If you have hypertension, you may need to reduce your sodium intake to 1,500 mg a day.  When on the DASH eating plan, aim to eat more fresh fruits and vegetables, whole grains, lean proteins, low-fat dairy, and heart-healthy fats.  Work with your health care provider or diet and nutrition specialist (dietitian) to  adjust your eating plan to your individual calorie needs. This information is not intended to replace advice given to you by your health care provider. Make sure you discuss any questions you have with your health care provider. Document Revised: 07/05/2017 Document Reviewed: 07/16/2016 Elsevier Patient Education  [place].  Dysmenorrhea Dysmenorrhea means painful cramps during your period (menstrual period). You will  have pain in your lower belly (abdomen). The pain is caused by the tightening (contracting) of the muscles of the womb (uterus). The pain may be mild or very bad. With this condition, you may:  Have a headache.  Feel sick to your stomach (nauseous).  Throw up (vomit).  Have lower back pain. Follow these instructions at home: Helping pain and cramping   Put heat on your lower back or belly when you have pain or cramps. Use the heat source that your doctor tells you to use. ? Place a towel between your skin and the heat. ? Leave the heat on for 20-30 minutes. ? Remove the heat if your skin turns bright red. This is especially important if you cannot feel pain, heat, or cold. ? Do not have a heating pad on during sleep.  Do aerobic exercises. These include walking, swimming, or biking. These may help with cramps.  Massage your lower back or belly. This may help lessen pain. General instructions  Take over-the-counter and prescription medicines only as told by your doctor.  Do not drive or use heavy machinery while taking prescription pain medicine.  Avoid alcohol and caffeine during and right before your period. These can make cramps worse.  Do not use any products that have nicotine or tobacco. These include cigarettes and e-cigarettes. If you need help quitting, ask your doctor.  Keep all follow-up visits as told by your doctor. This is important. Contact a doctor if:  You have pain that gets worse.  You have pain that does not get better with medicine.  You have pain during sex.  You feel sick to your stomach or you throw up during your period, and medicine does not help. Get help right away if:  You pass out (faint). Summary  Dysmenorrhea means painful cramps during your period (menstrual period).  Put heat on your lower back or belly when you have pain or cramps.  Do exercises like walking, swimming, or biking to help with cramps.  Contact a doctor if you have  pain during sex. This information is not intended to replace advice given to you by your health care provider. Make sure you discuss any questions you have with your health care provider. Document Revised: 07/05/2017 Document Reviewed: 08/09/2016 Elsevier Patient Education  [place].

## 2019-12-19 DIAGNOSIS — N946 Dysmenorrhea, unspecified: Secondary | ICD-10-CM | POA: Insufficient documentation

## 2019-12-19 MED ORDER — CITALOPRAM HYDROBROMIDE 10 MG PO TABS: 10.0000 mg | ORAL_TABLET | Freq: Every day | ORAL | 0 refills | Status: DC

## 2020-01-10 ENCOUNTER — Other Ambulatory Visit: Payer: Self-pay | Admitting: Family Medicine

## 2020-01-10 DIAGNOSIS — I10 Essential (primary) hypertension: Secondary | ICD-10-CM

## 2020-01-14 ENCOUNTER — Ambulatory Visit: Payer: 59 | Admitting: Nurse Practitioner

## 2020-01-15 ENCOUNTER — Ambulatory Visit: Payer: 59 | Admitting: Family Medicine

## 2020-02-19 ENCOUNTER — Other Ambulatory Visit: Payer: Self-pay | Admitting: Physician Assistant

## 2020-02-19 DIAGNOSIS — I10 Essential (primary) hypertension: Secondary | ICD-10-CM

## 2020-03-09 NOTE — Progress Notes (Signed)
Cancelled.  

## 2020-03-10 ENCOUNTER — Ambulatory Visit (INDEPENDENT_AMBULATORY_CARE_PROVIDER_SITE_OTHER): Payer: 59 | Admitting: Family Medicine

## 2020-03-10 DIAGNOSIS — I10 Essential (primary) hypertension: Secondary | ICD-10-CM

## 2020-03-10 DIAGNOSIS — F339 Major depressive disorder, recurrent, unspecified: Secondary | ICD-10-CM

## 2020-03-19 ENCOUNTER — Other Ambulatory Visit: Payer: Self-pay | Admitting: Physician Assistant

## 2020-03-19 DIAGNOSIS — I10 Essential (primary) hypertension: Secondary | ICD-10-CM

## 2020-03-30 NOTE — Progress Notes (Signed)
Subjective:  Patient ID: Rebekah Barnes, female    DOB: November 09, 1980  Age: 39 y.o. MRN: [id number]  Chief Complaint  Patient presents with  . Hypertension    HPI Patient is a 39 year old female who presents for follow-up of depression, hypertension and obesity.  Currently her depression is well controlled on citalopram 10 mg once daily.    Hypertension: She is currently on metoprolol/HCTZ 50-25 mg once daily.  She denies any chest pain, blurry vision, or headaches. This is working good except when moves positions.  Patient also suffers from morbid obesity.  BMI is 45.  She has intermittently been on Adipex since 2017.  It does not appear that she is ever taken it for the full 6 months.  She has had some success.  However she has difficulty eating healthy and exercising.  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  Drinking a lot of water and has decreased her soda intake significantly.  Ran out of phentermine 1 week ago. She has not regained weight. She has taken it for 3 months.   Patient is complaining of dysmenorrhea.  She has menstrual periods monthly which range from  heavy.  She has cramping 2 to 3 days before her period starts.  Takes naproxen twice a day which helps.  Her periods last 4 to 5 days.  Has been years since had an [place]. Pt feels dizzy and light headed at times.   Past Medical History:  Diagnosis Date  . Essential (primary) hypertension   . Morbid (severe) obesity due to excess calories Rebekah Barnes)    History reviewed. No pertinent surgical history.  Family History  Problem Relation Age of Onset  . Hypertension Father   . Renal Disease Father    Social History   Socioeconomic History  . Marital status: Single    Spouse name: Not on file  . Number of children: Not on file  . Years of education: Not on file  . Highest education level: Not on file  Occupational History  . Not on file  Tobacco Use  . Smoking status: Never Smoker  .  Smokeless tobacco: Never Used  Vaping Use  . Vaping Use: Never used  Substance and Sexual Activity  . Alcohol use: Not Currently  . Drug use: Never  . Sexual activity: Not on file  Other Topics Concern  . Not on file  Social History Narrative  . Not on file   Social Determinants of Health   Financial Resource Strain:   . Difficulty of Paying Living Expenses: Not on file  Food Insecurity:   . Worried About Charity fundraiser in the Last Year: Not on file  . Ran Out of Food in the Last Year: Not on file  Transportation Needs:   . Lack of Transportation (Medical): Not on file  . Lack of Transportation (Non-Medical): Not on file  Physical Activity:   . Days of Exercise per Week: Not on file  . Minutes of Exercise per Session: Not on file  Stress:   . Feeling of Stress : Not on file  Social Connections:   . Frequency of Communication with Friends and Family: Not on file  . Frequency of Social Gatherings with Friends and Family: Not on file  . Attends Religious Services: Not on file  . Active Member of Clubs or Organizations: Not on file  . Attends Archivist Meetings: Not on file  .  Marital Status: Not on file    Review of Systems  Constitutional: Negative for chills, fatigue and fever.  HENT: Negative for congestion, ear pain, rhinorrhea and sore throat.   Respiratory: Negative for cough and shortness of breath.   Cardiovascular: Negative for chest pain.  Gastrointestinal: Positive for abdominal pain. Negative for constipation, diarrhea, nausea and vomiting.  Genitourinary: Negative for dysuria and urgency.  Musculoskeletal: Negative for back pain and myalgias.  Neurological: Negative for dizziness, weakness, light-headedness and headaches (sometimes).  Psychiatric/Behavioral: Positive for dysphoric mood. The patient is not nervous/anxious.      Objective:  Temp (!) 97.3 F (36.3 C)   Resp 18   Ht 5\' 5"  (1.651 m)   Wt 264 lb (119.7 kg)   BMI 43.93 kg/m     114/70.    Physical Exam Vitals reviewed.  Constitutional:      Appearance: Normal appearance. She is obese.  Cardiovascular:     Rate and Rhythm: Normal rate and regular rhythm.     Pulses: Normal pulses.     Heart sounds: Normal heart sounds.  Pulmonary:     Effort: Pulmonary effort is normal. No respiratory distress.     Breath sounds: Normal breath sounds.  Abdominal:     General: Abdomen is flat. Bowel sounds are normal.     Palpations: Abdomen is soft.     Tenderness: There is abdominal tenderness (BL lower quadrants ( improved since last pelvic exam 6 months ago. ).  Neurological:     Mental Status: She is alert and oriented to person, place, and time.  Psychiatric:        Mood and Affect: Mood normal.        Behavior: Behavior normal.     Lab Results  Component Value Date   WBC 13.6 (H) 03/31/2020   HGB 11.4 03/31/2020   HCT 36.1 03/31/2020   PLT 414 03/31/2020   GLUCOSE 85 03/31/2020   CHOL 139 03/31/2020   TRIG 73 03/31/2020   HDL 43 03/31/2020   LDLCALC 81 03/31/2020   ALT 13 03/31/2020   AST 13 03/31/2020   NA 139 03/31/2020   K 3.8 03/31/2020   CL 101 03/31/2020   CREATININE 0.78 03/31/2020   BUN 10 03/31/2020   CO2 26 03/31/2020   TSH 1.220 03/31/2020      Assessment & Plan:  1. Essential hypertension Too well controlled. Mildly orthostatic.  Decrease metoprolol/hctz 50/12.5  to 1/2 pill daily Continue to work on eating a healthy diet and exercise.  Check blood pressure daily.  If persistent dizziness please call the office.  2. Mild recurrent major depression ([place]) The current medical regimen is effective;  continue present plan and medications.  3. Morbid obesity with BMI 45.0-49.9, adult ([place]) -Diet given.  Discussed importance of portion control.  Discussed not skipping meals.  Increase exercise. Reorder phentermine 37.5 mg once daily in a.m.  Continue to work on eating healthy and exercising.  Normally prefer to see patients monthly  but due to the current Covid pandemic will stretch to 3 months.  Patient call if any issues.  4.  Menorrhagia check pelvic ultrasound 5. Dysmenorrhea check pelvic ultrasound.  Continue naproxen. 6. Flu vaccination given.   Follow-up: Return in about 3 months (around 07/01/2020) for fasting labs. .  An After Visit Summary was printed and given to the patient.  Rochel Brome [facility] [phone]

## 2020-03-31 ENCOUNTER — Other Ambulatory Visit: Payer: Self-pay

## 2020-03-31 ENCOUNTER — Ambulatory Visit: Payer: 59 | Admitting: Family Medicine

## 2020-03-31 ENCOUNTER — Encounter: Payer: Self-pay | Admitting: Family Medicine

## 2020-03-31 VITALS — Temp 97.3°F | Resp 18 | Ht 65.0 in | Wt 264.0 lb

## 2020-03-31 DIAGNOSIS — Z6841 Body Mass Index (BMI) 40.0 and over, adult: Secondary | ICD-10-CM

## 2020-03-31 DIAGNOSIS — N946 Dysmenorrhea, unspecified: Secondary | ICD-10-CM | POA: Diagnosis not present

## 2020-03-31 DIAGNOSIS — F330 Major depressive disorder, recurrent, mild: Secondary | ICD-10-CM | POA: Diagnosis not present

## 2020-03-31 DIAGNOSIS — N920 Excessive and frequent menstruation with regular cycle: Secondary | ICD-10-CM

## 2020-03-31 DIAGNOSIS — I10 Essential (primary) hypertension: Secondary | ICD-10-CM | POA: Diagnosis not present

## 2020-03-31 DIAGNOSIS — Z23 Encounter for immunization: Secondary | ICD-10-CM | POA: Diagnosis not present

## 2020-03-31 MED ORDER — METOPROLOL-HYDROCHLOROTHIAZIDE 50-25 MG PO TABS: 0.5000 | ORAL_TABLET | Freq: Every day | ORAL | 0 refills | Status: DC

## 2020-03-31 NOTE — Patient Instructions (Addendum)
Ordering pelvic ultrasound for painful and heavy periods. Reorder phentermine 37.5 mg once daily in a.m.  Continue to work on eating healthy and exercising.  Normally prefer to see patients monthly but due to the current Covid pandemic will stretch to 3 months.  Patient call if any issues. Continue naproxen for painful periods. Hypertension: decrease metoprolol/HCTZ 50/25 mg to half pill daily. Check blood pressure daily.  If persistent dizziness please call the office.

## 2020-04-01 LAB — LIPID PANEL
Chol/HDL Ratio: 3.2 ratio (ref 0.0–4.4)
Cholesterol, Total: 139 mg/dL (ref 100–199)
HDL: 43 mg/dL (ref >39)
LDL Chol Calc (NIH): 81 mg/dL (ref 0–99)
Triglycerides: 73 mg/dL (ref 0–149)
VLDL Cholesterol Cal: 15 mg/dL (ref 5–40)

## 2020-04-01 LAB — COMPREHENSIVE METABOLIC PANEL
ALT: 13 IU/L (ref 0–32)
AST: 13 IU/L (ref 0–40)
Albumin/Globulin Ratio: 1.4 (ref 1.2–2.2)
Albumin: 3.8 g/dL (ref 3.8–4.8)
Alkaline Phosphatase: 87 IU/L (ref 48–121)
BUN/Creatinine Ratio: 13 (ref 9–23)
BUN: 10 mg/dL (ref 6–20)
Bilirubin Total: 0.3 mg/dL (ref 0.0–1.2)
CO2: 26 mmol/L (ref 20–29)
Calcium: 8.9 mg/dL (ref 8.7–10.2)
Chloride: 101 mmol/L (ref 96–106)
Creatinine, Ser: 0.78 mg/dL (ref 0.57–1.00)
GFR calc Af Amer: 112 mL/min/{1.73_m2} (ref >59)
GFR calc non Af Amer: 97 mL/min/{1.73_m2} (ref >59)
Globulin, Total: 2.8 g/dL (ref 1.5–4.5)
Glucose: 85 mg/dL (ref 65–99)
Potassium: 3.8 mmol/L (ref 3.5–5.2)
Sodium: 139 mmol/L (ref 134–144)
Total Protein: 6.6 g/dL (ref 6.0–8.5)

## 2020-04-01 LAB — CBC WITH DIFFERENTIAL/PLATELET
Basophils Absolute: 0.1 10*3/uL (ref 0.0–0.2)
Basos: 0 %
EOS (ABSOLUTE): 0.3 10*3/uL (ref 0.0–0.4)
Eos: 2 %
Hematocrit: 36.1 % (ref 34.0–46.6)
Hemoglobin: 11.4 g/dL (ref 11.1–15.9)
Immature Grans (Abs): 0 10*3/uL (ref 0.0–0.1)
Immature Granulocytes: 0 %
Lymphocytes Absolute: 2.8 10*3/uL (ref 0.7–3.1)
Lymphs: 20 %
MCH: 27.4 pg (ref 26.6–33.0)
MCHC: 31.6 g/dL (ref 31.5–35.7)
MCV: 87 fL (ref 79–97)
Monocytes Absolute: 1.2 10*3/uL — ABNORMAL HIGH (ref 0.1–0.9)
Monocytes: 9 %
Neutrophils Absolute: 9.2 10*3/uL — ABNORMAL HIGH (ref 1.4–7.0)
Neutrophils: 69 %
Platelets: 414 10*3/uL (ref 150–450)
RBC: 4.16 x10E6/uL (ref 3.77–5.28)
RDW: 14.1 % (ref 11.7–15.4)
WBC: 13.6 10*3/uL — ABNORMAL HIGH (ref 3.4–10.8)

## 2020-04-01 LAB — TSH: TSH: 1.22 u[IU]/mL (ref 0.450–4.500)

## 2020-04-01 LAB — CARDIOVASCULAR RISK ASSESSMENT

## 2020-04-05 ENCOUNTER — Other Ambulatory Visit: Payer: Self-pay

## 2020-04-05 DIAGNOSIS — R799 Abnormal finding of blood chemistry, unspecified: Secondary | ICD-10-CM

## 2020-04-07 ENCOUNTER — Ambulatory Visit
Admission: RE | Admit: 2020-04-07 | Discharge: 2020-04-07 | Disposition: A | Discharge status: Home / Self Care | Payer: 59 | Source: Ambulatory Visit | Attending: Family Medicine | Admitting: Family Medicine

## 2020-04-07 DIAGNOSIS — N920 Excessive and frequent menstruation with regular cycle: Secondary | ICD-10-CM

## 2020-04-07 DIAGNOSIS — N946 Dysmenorrhea, unspecified: Secondary | ICD-10-CM

## 2020-04-08 ENCOUNTER — Other Ambulatory Visit: Payer: Self-pay

## 2020-04-08 DIAGNOSIS — N946 Dysmenorrhea, unspecified: Secondary | ICD-10-CM

## 2020-04-08 DIAGNOSIS — D259 Leiomyoma of uterus, unspecified: Secondary | ICD-10-CM

## 2020-04-11 ENCOUNTER — Encounter: Payer: Self-pay | Admitting: Family Medicine

## 2020-04-21 ENCOUNTER — Other Ambulatory Visit: Payer: Self-pay | Admitting: Family Medicine

## 2020-04-28 ENCOUNTER — Ambulatory Visit: Payer: 59 | Admitting: Obstetrics and Gynecology

## 2020-04-28 ENCOUNTER — Other Ambulatory Visit: Payer: Self-pay

## 2020-04-28 ENCOUNTER — Encounter: Payer: Self-pay | Admitting: Obstetrics and Gynecology

## 2020-04-28 VITALS — BP 126/80 | Ht 65.0 in | Wt 264.0 lb

## 2020-04-28 DIAGNOSIS — D252 Subserosal leiomyoma of uterus: Secondary | ICD-10-CM

## 2020-04-28 DIAGNOSIS — N946 Dysmenorrhea, unspecified: Secondary | ICD-10-CM

## 2020-04-28 DIAGNOSIS — N920 Excessive and frequent menstruation with regular cycle: Secondary | ICD-10-CM

## 2020-04-28 MED ORDER — MEFENAMIC ACID 250 MG PO CAPS: 250.0000 mg | ORAL_CAPSULE | Freq: Four times a day (QID) | ORAL | 1 refills | Status: AC

## 2020-04-28 NOTE — Progress Notes (Signed)
Ori Kreiter Jul 02, 1981 [id number]  SUBJECTIVE:  39 y.o. G0P0000 female new patient presents for evaluation of management from her primary care doctor regarding fibroid uterus, painful periods, heavy periods. He presented with these concerns.  Part of her work-up included a pelvic ultrasound 04/07/2020 which showed a 10.9 x 7.5 x 6.8 cm anteverted uterus with a anterior upper uterine leiomyoma on the left side, subserosal, 4.8 centimeters.  She also had a 4.4 cm subserosal posterior fundal leiomyoma. Normal TSH. Normal Pap 10/22/2019.  Very painful cramps on her period and can cramp and intermittently without periods.  Periods are regular and she gets 1 period per month, period lasts 4-5 days.  Uses super plus tampons and changes them every 2 hours during heaviest 1-2 days of her period.  She has tried a heating pad and naproxen 500 mg as needed on her cycle twice per day which does not provide her with sufficient pain relief.  Periods have gotten worse as she has gotten older.  She states she has used birth control pills for at least a few months the past and her period was slightly less heavy, but cramps were still bad.  She also said her blood pressure went up when taking the pill.   Used Depo Provera injection but said it caused her to gain weight. No prior pregnancies.  Not planning to have children.     Current Outpatient Medications  Medication Sig Dispense Refill  . citalopram (CELEXA) 10 MG tablet Take 1 tablet (10 mg total) by mouth daily. 90 tablet 0  . metoprolol-hydrochlorothiazide (LOPRESSOR HCT) 50-25 MG tablet Take 0.5 tablets by mouth daily. 45 tablet 0  . Multiple Vitamin (MULTIVITAMIN) tablet Take 1 tablet by mouth daily.    . naproxen (EC NAPROSYN) 500 MG EC tablet Take 1 tablet (500 mg total) by mouth 2 (two) times daily as needed (menstrual cramps.). 60 tablet 3  . phentermine 37.5 MG capsule Take 1 capsule by mouth in the morning 30 capsule 0   No current  facility-administered medications for this visit.   Allergies: Lisinopril  Patient's last menstrual period was 04/17/2020.  Past medical history,surgical history, problem list, medications, allergies, family history and social history were all reviewed and documented as reviewed in the EPIC chart.  ROS: Pertinent positives and negatives as reviewed in HPI    OBJECTIVE:  BP 126/80 (Cuff Size: Large)   Ht 5\' 5"  (1.651 m)   Wt 264 lb (119.7 kg)   LMP 04/17/2020   BMI 43.93 kg/m  The patient appears well, alert, oriented, in no distress. PELVIC EXAM: Deferred to future visit   ASSESSMENT:  39 y.o. G0P0000 here for abnormal uterine bleeding and dysmenorrhea with a fibroid uterus  PLAN:  We discussed scheduled NSAID therapy.  Since she has failed over-the-counter NSAIDs we will try mefenamic acid 500 mg and then 250 mg every 6 hours taken with food for up to 3 to 4 days, starting the day before she expects onset of her period.  I sent a prescription to her pharmacy.  Evidence suggests that starting scheduled NSAID therapy before onset of menses can greatly reduce dysmenorrhea and menstrual blood flow quantity.  I discussed not taking naproxen while taking this therapy. We also discussed next step could include Mirena IUD insertion and we showed her the IUD model and briefly discussed this option.  Procedural options to address fibroids include myomectomy and hysterectomy.  Acessa fibroid ablation will eventually be a procedure here locally as well but  we are not quite there yet. I I left her with information about dysmenorrhea, fibroids, and the IUD in her chart.  I told her to follow-up if the NSAID therapy is not providing her with sufficient relief.   Joseph Pierini MD 04/28/20

## 2020-04-28 NOTE — Patient Instructions (Addendum)
Mefenamic acid 250 mg tablets Take 1 capsule (250 mg total) by mouth every 6 (six) hours for 3 days. Take 500 mg (2 tabs) 1 day before onset of period, then 250 mg 4 times per day. Take with food. Do this for 3-4 days each menstrual cycle  Try this for 2-3 cycles and if it is not giving her enough menstrual bleeding symptom relief or painful period symptom relief then please let us know  The next option would be to consider Mirena IUD insertion to help with the heavy periods    Dysmenorrhea  Dysmenorrhea refers to cramps caused by the muscles of the uterus tightening (contracting) during a menstrual period. Dysmenorrhea may be mild, or it may be severe enough to interfere with everyday activities for a few days each month. Primary dysmenorrhea is menstrual cramps that last a couple of days when you start having menstrual periods or soon after. This often begins after a teenager starts having her period. As a woman gets older or has a baby, the cramps will usually lessen or disappear. Secondary dysmenorrhea begins later in life and is caused by a disorder in the reproductive system. It lasts longer, and it may cause more pain than primary dysmenorrhea. The pain may start before the period and last a few days after the period. What are the causes? Dysmenorrhea is usually caused by an underlying problem, such as:  The tissue that lines the uterus (endometrium) growing outside of the uterus in other areas of the body (endometriosis).  Endometrial tissue growing into the muscular walls of the uterus (adenomyosis).  Blood vessels in the pelvis becoming filled with blood just before the menstrual period (pelvic congestive syndrome).  Overgrowth of cells (polyps) in the endometrium or the lower part of the uterus (cervix).  The uterus dropping down into the vagina (prolapse) due to stretched or weak muscles.  Bladder problems, such as infection or inflammation.  Intestinal problems, such as a  tumor or irritable bowel syndrome.  Cancer of the reproductive organs or bladder.  A severely tipped uterus.  A cervix that is closed or has a very small opening.  Noncancerous (benign) tumors of the uterus (fibroids).  Pelvic inflammatory disease (PID).  Pelvic scarring (adhesions) from a previous surgery.  An ovarian cyst.  An IUD (intrauterine device). What increases the risk? You are more likely to develop this condition if:  You are younger than age 72.  You started puberty early.  You have irregular or heavy bleeding.  You have never given birth.  You have a family history of dysmenorrhea.  You smoke. What are the signs or symptoms? Symptoms of this condition include:  Cramping, throbbing pain, or a feeling of fullness in the lower abdomen.  Lower back pain.  Periods lasting for longer than 7 days.  Headaches.  Bloating.  Fatigue.  Nausea or vomiting.  Diarrhea.  Sweating or dizziness.  Loose stools. How is this diagnosed? This condition may be diagnosed based on:  Your symptoms.  Your medical history.  A physical exam.  Blood tests.  A Pap test. This is a test in which cells from the cervix are tested for signs of cancer or infection.  A pregnancy test.  Imaging tests, such as: ? Ultrasound. ? A procedure to remove and examine a sample of endometrial tissue (dilation and curettage, D&C). ? A procedure to visually examine the inside of:  The uterus (hysteroscopy).  The abdomen or pelvis (laparoscopy).  The bladder (cystoscopy).  The intestine (  colonoscopy).  The stomach (gastroscopy). ? X-rays. ? CT scan. ? MRI. How is this treated? Treatment depends on the cause of the dysmenorrhea. Treatment may include:  Pain medicine prescribed by your health care provider.  Birth control pills that contain the hormone progesterone.  An IUD that contains the hormone progesterone.  Medicines to control bleeding.  Hormone  replacement therapy.  NSAIDs. These may help to stop the production of hormones that cause cramps.  Antidepressant medicines.  Surgery to remove adhesions, endometriosis, ovarian cysts, fibroids, or the entire uterus (hysterectomy).  Injections of progesterone to stop the menstrual period.  A procedure to destroy the endometrium (endometrial ablation).  A procedure to cut the nerves in the bottom of the spine (sacrum) that go to the reproductive organs (presacral neurectomy).  A procedure to apply an electric current to nerves in the sacrum (sacral nerve stimulation).  Exercise and physical therapy.  Meditation and yoga therapy.  Acupuncture. Work with your health care provider to determine what treatment or combination of treatments is best for you. Follow these instructions at home: Relieving pain and cramping  Apply heat to your lower back or abdomen when you experience pain or cramps. Use the heat source that your health care provider recommends, such as a moist heat pack or a heating pad. ? Place a towel between your skin and the heat source. ? Leave the heat on for 20-30 minutes. ? Remove the heat if your skin turns bright red. This is especially important if you are unable to feel pain, heat, or cold. You may have a greater risk of getting burned. ? Do not sleep with a heating pad on.  Do aerobic exercises, such as walking, swimming, or biking. This can help to relieve cramps.  Massage your lower back or abdomen to help relieve pain. General instructions  Take over-the-counter and prescription medicines only as told by your health care provider.  Do not drive or use heavy machinery while taking prescription pain medicine.  Avoid alcohol and caffeine during and right before your menstrual period. These can make cramps worse.  Do not use any products that contain nicotine or tobacco, such as cigarettes and e-cigarettes. If you need help quitting, ask your health care  provider.  Keep all follow-up visits as told by your health care provider. This is important. Contact a health care provider if:  You have pain that gets worse or does not get better with medicine.  You have pain with sex.  You develop nausea or vomiting with your period that is not controlled with medicine. Get help right away if:  You faint. Summary  Dysmenorrhea refers to cramps caused by the muscles of the uterus tightening (contracting) during a menstrual period.  Dysmenorrhea may be mild, or it may be severe enough to interfere with everyday activities for a few days each month.  Treatment depends on the cause of the dysmenorrhea.  Work with your health care provider to determine what treatment or combination of treatments is best for you. This information is not intended to replace advice given to you by your health care provider. Make sure you discuss any questions you have with your health care provider. Document Revised: 07/05/2017 Document Reviewed: 08/25/2016 Elsevier Patient Education  2020 [place].  Uterine Fibroids  Uterine fibroids (leiomyomas) are noncancerous (benign) tumors that can develop in the uterus. Fibroids may also develop in the fallopian tubes, cervix, or tissues (ligaments) near the uterus. You may have one or many fibroids. Fibroids  vary in size, weight, and where they grow in the uterus. Some can become quite large. Most fibroids do not require medical treatment. What are the causes? The cause of this condition is not known. What increases the risk? You are more likely to develop this condition if you:  Are in your 30s or 40s and have not gone through menopause.  Have a family history of this condition.  Are of African-American descent.  Had your first period at an early age (early menarche).  Have not had any children (nulliparity).  Are overweight or obese. What are the signs or symptoms? Many women do not have any symptoms.  Symptoms of this condition may include:  Heavy menstrual bleeding.  Bleeding or spotting between periods.  Pain and pressure in the pelvic area, between the hips.  Bladder problems, such as needing to urinate urgently or more often than usual.  Inability to have children (infertility).  Failure to carry pregnancy to term (miscarriage). How is this diagnosed? This condition may be diagnosed based on:  Your symptoms and medical history.  A physical exam.  A pelvic exam that includes feeling for any tumors.  Imaging tests, such as ultrasound or MRI. How is this treated? Treatment for this condition may include:  Seeing your health care provider for follow-up visits to monitor your fibroids for any changes.  Taking NSAIDs such as ibuprofen, naproxen, or aspirin to reduce pain.  Hormone medicines. These may be taken as a pill, given in an injection, or delivered by a T-shaped device that is inserted into the uterus (intrauterine device, IUD).  Surgery to remove one of the following: ? The fibroids (myomectomy). Your health care provider may recommend this if fibroids affect your fertility and you want to become pregnant. ? The uterus (hysterectomy). ? Blood supply to the fibroids (uterine artery embolization). Follow these instructions at home:  Take over-the-counter and prescription medicines only as told by your health care provider.  Ask your health care provider if you should take iron pills or eat more iron-rich foods, such as dark green, leafy vegetables. Heavy menstrual bleeding can cause low iron levels.  If directed, apply heat to your back or abdomen to reduce pain. Use the heat source that your health care provider recommends, such as a moist heat pack or a heating pad. ? Place a towel between your skin and the heat source. ? Leave the heat on for 20-30 minutes. ? Remove the heat if your skin turns bright red. This is especially important if you are unable to feel  pain, heat, or cold. You may have a greater risk of getting burned.  Pay close attention to your menstrual cycle. Tell your health care provider about any changes, such as: ? Increased blood flow that requires you to use more pads or tampons than usual. ? A change in the number of days that your period lasts. ? A change in symptoms that are associated with your period, such as back pain or cramps in your abdomen.  Keep all follow-up visits as told by your health care provider. This is important, especially if your fibroids need to be monitored for any changes. Contact a health care provider if you:  Have pelvic pain, back pain, or cramps in your abdomen that do not get better with medicine or heat.  Develop new bleeding between periods.  Have increased bleeding during or between periods.  Feel unusually tired or weak.  Feel light-headed. Get help right away if you:  Faint.  Have pelvic pain that suddenly gets worse.  Have severe vaginal bleeding that soaks a tampon or pad in 30 minutes or less. Summary  Uterine fibroids are noncancerous (benign) tumors that can develop in the uterus.  The exact cause of this condition is not known.  Most fibroids do not require medical treatment unless they affect your ability to have children (fertility).  Contact a health care provider if you have pelvic pain, back pain, or cramps in your abdomen that do not get better with medicines.  Make sure you know what symptoms should cause you to get help right away. This information is not intended to replace advice given to you by your health care provider. Make sure you discuss any questions you have with your health care provider. Document Revised: 07/05/2017 Document Reviewed: 06/18/2017 Elsevier Patient Education  [place]. Levonorgestrel intrauterine device (IUD) What is this medicine? LEVONORGESTREL IUD (LEE voe nor jes trel) is a contraceptive (birth control) device. The device  is placed inside the uterus by a healthcare professional. It is used to prevent pregnancy. This device can also be used to treat heavy bleeding that occurs during your period. This medicine may be used for other purposes; ask your health care provider or pharmacist if you have questions. COMMON BRAND NAME(S): Minette Headland What should I tell my health care provider before I take this medicine? They need to know if you have any of these conditions:  abnormal Pap smear  cancer of the breast, uterus, or cervix  diabetes  endometritis  genital or pelvic infection now or in the past  have more than one sexual partner or your partner has more than one partner  heart disease  history of an ectopic or tubal pregnancy  immune system problems  IUD in place  liver disease or tumor  problems with blood clots or take blood-thinners  seizures  use intravenous drugs  uterus of unusual shape  vaginal bleeding that has not been explained  an unusual or allergic reaction to levonorgestrel, other hormones, silicone, or polyethylene, medicines, foods, dyes, or preservatives  pregnant or trying to get pregnant  breast-feeding How should I use this medicine? This device is placed inside the uterus by a health care professional. Talk to your pediatrician regarding the use of this medicine in children. Special care may be needed. Overdosage: If you think you have taken too much of this medicine contact a poison control center or emergency room at once. NOTE: This medicine is only for you. Do not share this medicine with others. What if I miss a dose? This does not apply. Depending on the brand of device you have inserted, the device will need to be replaced every 3 to 6 years if you wish to continue using this type of birth control. What may interact with this medicine? Do not take this medicine with any of the following  medications:  amprenavir  bosentan  fosamprenavir This medicine may also interact with the following medications:  aprepitant  armodafinil  barbiturate medicines for inducing sleep or treating seizures  bexarotene  boceprevir  griseofulvin  medicines to treat seizures like carbamazepine, ethotoin, felbamate, oxcarbazepine, phenytoin, topiramate  modafinil  pioglitazone  rifabutin  rifampin  rifapentine  some medicines to treat HIV infection like atazanavir, efavirenz, indinavir, lopinavir, nelfinavir, tipranavir, ritonavir  St. John's wort  warfarin This list may not describe all possible interactions. Give your health care provider a list of all the medicines, herbs, non-prescription  drugs, or dietary supplements you use. Also tell them if you smoke, drink alcohol, or use illegal drugs. Some items may interact with your medicine. What should I watch for while using this medicine? Visit your doctor or health care professional for regular check ups. See your doctor if you or your partner has sexual contact with others, becomes HIV positive, or gets a sexual transmitted disease. This product does not protect you against HIV infection (AIDS) or other sexually transmitted diseases. You can check the placement of the IUD yourself by reaching up to the top of your vagina with clean fingers to feel the threads. Do not pull on the threads. It is a good habit to check placement after each menstrual period. Call your doctor right away if you feel more of the IUD than just the threads or if you cannot feel the threads at all. The IUD may come out by itself. You may become pregnant if the device comes out. If you notice that the IUD has come out use a backup birth control method like condoms and call your health care provider. Using tampons will not change the position of the IUD and are okay to use during your period. This IUD can be safely scanned with magnetic resonance imaging  (MRI) only under specific conditions. Before you have an MRI, tell your healthcare provider that you have an IUD in place, and which type of IUD you have in place. What side effects may I notice from receiving this medicine? Side effects that you should report to your doctor or health care professional as soon as possible:  allergic reactions like skin rash, itching or hives, swelling of the face, lips, or tongue  fever, flu-like symptoms  genital sores  high blood pressure  no menstrual period for 6 weeks during use  pain, swelling, warmth in the leg  pelvic pain or tenderness  severe or sudden headache  signs of pregnancy  stomach cramping  sudden shortness of breath  trouble with balance, talking, or walking  unusual vaginal bleeding, discharge  yellowing of the eyes or skin Side effects that usually do not require medical attention (report to your doctor or health care professional if they continue or are bothersome):  acne  breast pain  change in sex drive or performance  changes in weight  cramping, dizziness, or faintness while the device is being inserted  headache  irregular menstrual bleeding within first 3 to 6 months of use  nausea This list may not describe all possible side effects. Call your doctor for medical advice about side effects. You may report side effects to FDA at 1-800-FDA-1088. Where should I keep my medicine? This does not apply. NOTE: This sheet is a summary. It may not cover all possible information. If you have questions about this medicine, talk to your doctor, pharmacist, or health care provider.  2020 Elsevier/Gold Standard (2018-06-03 13:22:01)

## 2020-05-05 ENCOUNTER — Ambulatory Visit (INDEPENDENT_AMBULATORY_CARE_PROVIDER_SITE_OTHER): Payer: 59 | Admitting: Nurse Practitioner

## 2020-05-05 ENCOUNTER — Other Ambulatory Visit: Payer: Self-pay

## 2020-05-05 ENCOUNTER — Other Ambulatory Visit: Payer: 59

## 2020-05-05 ENCOUNTER — Encounter: Payer: Self-pay | Admitting: Nurse Practitioner

## 2020-05-05 VITALS — BP 138/98 | HR 95 | Temp 97.9°F | Ht 65.0 in | Wt 261.0 lb

## 2020-05-05 DIAGNOSIS — J0110 Acute frontal sinusitis, unspecified: Secondary | ICD-10-CM | POA: Diagnosis not present

## 2020-05-05 DIAGNOSIS — J3089 Other allergic rhinitis: Secondary | ICD-10-CM | POA: Diagnosis not present

## 2020-05-05 DIAGNOSIS — J309 Allergic rhinitis, unspecified: Secondary | ICD-10-CM | POA: Insufficient documentation

## 2020-05-05 LAB — CBC WITH DIFFERENTIAL/PLATELET
Basophils Absolute: 0.1 10*3/uL (ref 0.0–0.2)
Basos: 0 %
EOS (ABSOLUTE): 0.4 10*3/uL (ref 0.0–0.4)
Eos: 3 %
Hematocrit: 36.1 % (ref 34.0–46.6)
Hemoglobin: 11.7 g/dL (ref 11.1–15.9)
Immature Grans (Abs): 0 10*3/uL (ref 0.0–0.1)
Immature Granulocytes: 0 %
Lymphocytes Absolute: 3.3 10*3/uL — ABNORMAL HIGH (ref 0.7–3.1)
Lymphs: 23 %
MCH: 28 pg (ref 26.6–33.0)
MCHC: 32.4 g/dL (ref 31.5–35.7)
MCV: 86 fL (ref 79–97)
Monocytes Absolute: 1 10*3/uL — ABNORMAL HIGH (ref 0.1–0.9)
Monocytes: 7 %
Neutrophils Absolute: 9.3 10*3/uL — ABNORMAL HIGH (ref 1.4–7.0)
Neutrophils: 67 %
Platelets: 419 10*3/uL (ref 150–450)
RBC: 4.18 x10E6/uL (ref 3.77–5.28)
RDW: 13.4 % (ref 11.7–15.4)
WBC: 14 10*3/uL — ABNORMAL HIGH (ref 3.4–10.8)

## 2020-05-05 MED ORDER — FLUTICASONE PROPIONATE 50 MCG/ACT NA SUSP: 2.0000 | Freq: Every day | NASAL | 6 refills | Status: DC

## 2020-05-05 MED ORDER — AMOXICILLIN 875 MG PO TABS: 875.0000 mg | ORAL_TABLET | Freq: Two times a day (BID) | ORAL | 0 refills | Status: AC

## 2020-05-05 NOTE — Patient Instructions (Addendum)
Earache, Adult An earache, or ear pain, can be caused by many things, including:  An infection.  Ear wax buildup.  Ear pressure.  Something in the ear that should not be there (foreign body).  A sore throat.  Tooth problems.  Jaw problems. Treatment of the earache will depend on the cause. If the cause is not clear or cannot be determined, you may need to watch your symptoms until your earache goes away or until a cause is found. Follow these instructions at home: Medicines  Take or apply over-the-counter and prescription medicines only as told by your health care provider.  If you were prescribed an antibiotic medicine, use it as told by your health care provider. Do not stop using the antibiotic even if you start to feel better.  Do not put anything in your ear other than medicine that is prescribed by your health care provider. Managing pain If directed, apply heat to the affected area as often as told by your health care provider. Use the heat source that your health care provider recommends, such as a moist heat pack or a heating pad.  Place a towel between your skin and the heat source.  Leave the heat on for 20-30 minutes.  Remove the heat if your skin turns bright red. This is especially important if you are unable to feel pain, heat, or cold. You may have a greater risk of getting burned. If directed, put ice on the affected area as often as told by your health care provider. To do this:      Put ice in a plastic bag.  Place a towel between your skin and the bag.  Leave the ice on for 20 minutes, 2-3 times a day. General instructions  Pay attention to any changes in your symptoms.  Try resting in an upright position instead of lying down. This may help to reduce pressure in your ear and relieve pain.  Chew gum if it helps to relieve your ear pain.  Treat any allergies as told by your health care provider.  Drink enough fluid to keep your urine pale  yellow.  It is up to you to get the results of any tests that were done. Ask your health care provider, or the department that is doing the tests, when your results will be ready.  Keep all follow-up visits as told by your health care provider. This is important. Contact a health care provider if:  Your pain does not improve within 2 days.  Your earache gets worse.  You have new symptoms.  You have a fever. Get help right away if you:  Have a severe headache.  Have a stiff neck.  Have trouble swallowing.  Have redness or swelling behind your ear.  Have fluid or blood coming from your ear.  Have hearing loss.  Feel dizzy. Summary  An earache, or ear pain, can be caused by many things.  Treatment of the earache will depend on the cause. Follow recommendations from your health care provider to treat your ear pain.  If the cause is not clear or cannot be determined, you may need to watch your symptoms until your earache goes away or until a cause is found.  Keep all follow-up visits as told by your health care provider. This is important. This information is not intended to replace advice given to you by your health care provider. Make sure you discuss any questions you have with your health care provider. Document Revised: 02/28/2019   Document Reviewed: 02/28/2019 Elsevier Patient Education  [employer].   Fluticasone nasal spray What is this medicine? FLUTICASONE (floo TIK a sone) is a corticosteroid. This medicine is used to treat the symptoms of allergies like sneezing, itchy red eyes, and itchy, runny, or stuffy nose. This medicine is also used to treat nasal polyps. This medicine may be used for other purposes; ask your health care provider or pharmacist if you have questions. COMMON BRAND NAME(S): ClariSpray, Flonase, Flonase Allergy Relief, Flonase Sensimist, Veramyst, XHANCE What should I tell my health care provider before I take this medicine? They need  to know if you have any of these conditions:  eye disease, vision problems  infection, like tuberculosis, herpes, or fungal infection  recent surgery on nose or sinuses  taking a corticosteroid by mouth  an unusual or allergic reaction to fluticasone, steroids, other medicines, foods, dyes, or preservatives  pregnant or trying to get pregnant  breast-feeding How should I use this medicine? This medicine is for use in the nose. Follow the directions on your product or prescription label. This medicine works best if used at regular intervals. Do not use more often than directed. Make sure that you are using your nasal spray correctly. After 6 months of daily use for allergies, talk to your doctor or health care professional before using it for a longer time. Ask your doctor or health care professional if you have any questions. Talk to your pediatrician regarding the use of this medicine in children. Special care may be needed. Some products have been used for allergies in children as young as 2 years. After 2 months of daily use without a prescription in a child, talk to your pediatrician before using it for a longer time. Use of this medicine for nasal polyps is not approved in children. Overdosage: If you think you have taken too much of this medicine contact a poison control center or emergency room at once. NOTE: This medicine is only for you. Do not share this medicine with others. What if I miss a dose? If you miss a dose, use it as soon as you remember. If it is almost time for your next dose, use only that dose and continue with your regular schedule. Do not use double or extra doses. What may interact with this medicine?  certain antibiotics like clarithromycin and telithromycin  certain medicines for fungal infections like ketoconazole, itraconazole, and voriconazole  conivaptan  nefazodone  some medicines for HIV  vaccines This list may not describe all possible  interactions. Give your health care provider a list of all the medicines, herbs, non-prescription drugs, or dietary supplements you use. Also tell them if you smoke, drink alcohol, or use illegal drugs. Some items may interact with your medicine. What should I watch for while using this medicine? Visit your healthcare professional for regular checks on your progress. Tell your healthcare professional if your symptoms do not start to get better or if they get worse. This medicine may increase your risk of getting an infection. Tell your doctor or health care professional if you are around anyone with measles or chickenpox, or if you develop sores or blisters that do not heal properly. What side effects may I notice from receiving this medicine? Side effects that you should report to your doctor or health care professional as soon as possible:  allergic reactions like skin rash, itching or hives, swelling of the face, lips, or tongue  changes in vision  crusting or  sores in the nose  nosebleed  signs and symptoms of infection like fever or chills; cough; sore throat  white patches or sores in the mouth or nose Side effects that usually do not require medical attention (report to your doctor or health care professional if they continue or are bothersome):  burning or irritation inside the nose or throat  changes in taste or smell  cough  headache This list may not describe all possible side effects. Call your doctor for medical advice about side effects. You may report side effects to FDA at 1-800-FDA-1088. Where should I keep my medicine? Keep out of the reach of children. Store at room temperature between 15 and 30 degrees C (59 and 86 degrees F). Avoid exposure to extreme heat, cold, or light. Throw away any unused medicine after the expiration date. NOTE: This sheet is a summary. It may not cover all possible information. If you have questions about this medicine, talk to your doctor,  pharmacist, or health care provider.  2020 Elsevier/Gold Standard (2017-08-15 14:10:08) Amoxicillin capsules or tablets What is this medicine? AMOXICILLIN (a mox i SIL in) is a penicillin antibiotic. It is used to treat certain kinds of bacterial infections. It will not work for colds, flu, or other viral infections. This medicine may be used for other purposes; ask your health care provider or pharmacist if you have questions. COMMON BRAND NAME(S): Amoxil, Moxilin, Sumox, Trimox What should I tell my health care provider before I take this medicine? They need to know if you have any of these conditions:  kidney disease  an unusual or allergic reaction to amoxicillin, other penicillins, cephalosporin antibiotics, other medicines, foods, dyes, or preservatives  pregnant or trying to get pregnant  breast-feeding How should I use this medicine? Take this medicine by mouth with a glass of water. Follow the directions on your prescription label. You can take it with or without food. If it upsets your stomach, take it with food. Take your medicine at regular intervals. Do not take your medicine more often than directed. Take all of your medicine as directed even if you think you are better. Do not skip doses or stop your medicine early. Talk to your pediatrician regarding the use of this medicine in children. While this drug may be prescribed for selected conditions, precautions do apply. Overdosage: If you think you have taken too much of this medicine contact a poison control center or emergency room at once. NOTE: This medicine is only for you. Do not share this medicine with others. What if I miss a dose? If you miss a dose, take it as soon as you can. If it is almost time for your next dose, take only that dose. Do not take double or extra doses. What may interact with this medicine?  allopurinol  birth control pills  certain antibiotics like chloramphenicol, erythromycin,  sulfamethoxazole, tetracycline  certain medicines that treat or prevent blood clots like warfarin This list may not describe all possible interactions. Give your health care provider a list of all the medicines, herbs, non-prescription drugs, or dietary supplements you use. Also tell them if you smoke, drink alcohol, or use illegal drugs. Some items may interact with your medicine. What should I watch for while using this medicine? Tell your health care professional if your symptoms do not start to get better or if they get worse. Do not treat diarrhea with over the counter products. Contact your health care professional if you have diarrhea that lasts  more than 2 days or if it is severe and watery. If you have diabetes, you may get a false-positive result for sugar in your urine. Check with your health care professional. Birth control may not work properly while you are taking this medicine. Talk to your health care professional about using an extra method of birth control. This medicine may cause serious skin reactions. They can happen weeks to months after starting the medicine. Contact your health care provider right away if you notice fevers or flu-like symptoms with a rash. The rash may be red or purple and then turn into blisters or peeling of the skin. Or, you might notice a red rash with swelling of the face, lips or lymph nodes in your neck or under your arms. What side effects may I notice from receiving this medicine? Side effects that you should report to your doctor or health care professional as soon as possible:  allergic reactions like skin rash, itching or hives, swelling of the face, lips, or tongue  bloody or watery diarrhea  breathing problems  feeling faint; lightheaded, falls  fever  redness, blistering, peeling or loosening of the skin, including inside the mouth  seizures  signs and symptoms of kidney injury like trouble passing urine or change in the amount of  urine  signs and symptoms of liver injury like dark yellow or brown urine; general ill feeling or flu-like symptoms; light-colored stools; loss of appetite; nausea; right upper belly pain; unusually weak or tired; yellowing of the eyes or skin  unusual bleeding or bruising  unusually weak or tired Side effects that usually do not require medical attention (report to your doctor or health care professional if they continue or are bothersome):  anxious  confusion  diarrhea  dizziness  headache  nausea, vomiting  stomach upset  trouble sleeping This list may not describe all possible side effects. Call your doctor for medical advice about side effects. You may report side effects to FDA at 1-800-FDA-1088. Where should I keep my medicine? Keep out of the reach of children. Store at room temperature between 20 and 25 degrees C (68 and 77 degrees F). Throw away any unused medicine after the expiration date. NOTE: This sheet is a summary. It may not cover all possible information. If you have questions about this medicine, talk to your doctor, pharmacist, or health care provider.  2020 Elsevier/Gold Standard (2018-10-03 14:32:29)  Sinusitis, Adult Sinusitis is soreness and swelling (inflammation) of your sinuses. Sinuses are hollow spaces in the bones around your face. They are located:  Around your eyes.  In the middle of your forehead.  Behind your nose.  In your cheekbones. Your sinuses and nasal passages are lined with a fluid called mucus. Mucus drains out of your sinuses. Swelling can trap mucus in your sinuses. This lets germs (bacteria, virus, or fungus) grow, which leads to infection. Most of the time, this condition is caused by a virus. What are the causes? This condition is caused by:  Allergies.  Asthma.  Germs.  Things that block your nose or sinuses.  Growths in the nose (nasal polyps).  Chemicals or irritants in the air.  Fungus (rare). What  increases the risk? You are more likely to develop this condition if:  You have a weak body defense system (immune system).  You do a lot of swimming or diving.  You use nasal sprays too much.  You smoke. What are the signs or symptoms? The main symptoms of this  condition are pain and a feeling of pressure around the sinuses. Other symptoms include:  Stuffy nose (congestion).  Runny nose (drainage).  Swelling and warmth in the sinuses.  Headache.  Toothache.  A cough that may get worse at night.  Mucus that collects in the throat or the back of the nose (postnasal drip).  Being unable to smell and taste.  Being very tired (fatigue).  A fever.  Sore throat.  Bad breath. How is this diagnosed? This condition is diagnosed based on:  Your symptoms.  Your medical history.  A physical exam.  Tests to find out if your condition is short-term (acute) or long-term (chronic). Your doctor may: ? Check your nose for growths (polyps). ? Check your sinuses using a tool that has a light (endoscope). ? Check for allergies or germs. ? Do imaging tests, such as an MRI or CT scan. How is this treated? Treatment for this condition depends on the cause and whether it is short-term or long-term.  If caused by a virus, your symptoms should go away on their own within 10 days. You may be given medicines to relieve symptoms. They include: ? Medicines that shrink swollen tissue in the nose. ? Medicines that treat allergies (antihistamines). ? A spray that treats swelling of the nostrils. ? Rinses that help get rid of thick mucus in your nose (nasal saline washes).  If caused by bacteria, your doctor may wait to see if you will get better without treatment. You may be given antibiotic medicine if you have: ? A very bad infection. ? A weak body defense system.  If caused by growths in the nose, you may need to have surgery. Follow these instructions at home: Medicines  Take,  use, or apply over-the-counter and prescription medicines only as told by your doctor. These may include nasal sprays.  If you were prescribed an antibiotic medicine, take it as told by your doctor. Do not stop taking the antibiotic even if you start to feel better. Hydrate and humidify   Drink enough water to keep your pee (urine) pale yellow.  Use a cool mist humidifier to keep the humidity level in your home above 50%.  Breathe in steam for 10-15 minutes, 3-4 times a day, or as told by your doctor. You can do this in the bathroom while a hot shower is running.  Try not to spend time in cool or dry air. Rest  Rest as much as you can.  Sleep with your head raised (elevated).  Make sure you get enough sleep each night. General instructions   Put a warm, moist washcloth on your face 3-4 times a day, or as often as told by your doctor. This will help with discomfort.  Wash your hands often with soap and water. If there is no soap and water, use hand sanitizer.  Do not smoke. Avoid being around people who are smoking (secondhand smoke).  Keep all follow-up visits as told by your doctor. This is important. Contact a doctor if:  You have a fever.  Your symptoms get worse.  Your symptoms do not get better within 10 days. Get help right away if:  You have a very bad headache.  You cannot stop throwing up (vomiting).  You have very bad pain or swelling around your face or eyes.  You have trouble seeing.  You feel confused.  Your neck is stiff.  You have trouble breathing. Summary  Sinusitis is swelling of your sinuses. Sinuses are hollow  spaces in the bones around your face.  This condition is caused by tissues in your nose that become inflamed or swollen. This traps germs. These can lead to infection.  If you were prescribed an antibiotic medicine, take it as told by your doctor. Do not stop taking it even if you start to feel better.  Keep all follow-up visits as  told by your doctor. This is important. This information is not intended to replace advice given to you by your health care provider. Make sure you discuss any questions you have with your health care provider. Document Revised: 12/23/2017 Document Reviewed: 12/23/2017 Elsevier Patient Education  [employer].

## 2020-05-05 NOTE — Progress Notes (Signed)
Acute Office Visit  Subjective:    Patient ID: Rebekah Barnes, female    DOB: 05-21-1981, 39 y.o.   MRN: [id number] Left ear pain   HPI Patient is in today for left ear pain for 3 days. The pain is described as aching pressure, radiating behind ear and down her neck. She has tried OTC warm sweet oil topically with minimal relief. She also reports sinus congestion and drainage. Shakeerah states she history of seasonal environmental allergies but is not currently taking her Claritin currently. She also stated that her mother had recently recovered from a sinus infection. She recently had an elevated WBC (13.6) on 03/31/20. She is scheduled to have repeat CBC today. She has completed COVID-19 vaccination and denies knowing of any recent exposure.  Past Medical History:  Diagnosis Date  . Essential (primary) hypertension   . Fibroid   . Morbid (severe) obesity due to excess calories ([place])     No past surgical history on file.  Family History  Problem Relation Age of Onset  . Hypertension Father   . Renal Disease Father     Social History   Socioeconomic History  . Marital status: Single    Spouse name: Not on file  . Number of children: Not on file  . Years of education: Not on file  . Highest education level: Not on file  Occupational History  . Not on file  Tobacco Use  . Smoking status: Never Smoker  . Smokeless tobacco: Never Used  Vaping Use  . Vaping Use: Never used  Substance and Sexual Activity  . Alcohol use: Yes    Comment: Social  . Drug use: Never  . Sexual activity: Not Currently    Comment: 1st intercourse 39 yo-Fewer than 5 partners  Other Topics Concern  . Not on file  Social History Narrative  . Not on file   Social Determinants of Health   Financial Resource Strain:   . Difficulty of Paying Living Expenses: Not on file  Food Insecurity:   . Worried About Charity fundraiser in the Last Year: Not on file  . Ran Out of Food in the Last Year: Not on  file  Transportation Needs:   . Lack of Transportation (Medical): Not on file  . Lack of Transportation (Non-Medical): Not on file  Physical Activity:   . Days of Exercise per Week: Not on file  . Minutes of Exercise per Session: Not on file  Stress:   . Feeling of Stress : Not on file  Social Connections:   . Frequency of Communication with Friends and Family: Not on file  . Frequency of Social Gatherings with Friends and Family: Not on file  . Attends Religious Services: Not on file  . Active Member of Clubs or Organizations: Not on file  . Attends Archivist Meetings: Not on file  . Marital Status: Not on file  Intimate Partner Violence:   . Fear of Current or Ex-Partner: Not on file  . Emotionally Abused: Not on file  . Physically Abused: Not on file  . Sexually Abused: Not on file    Outpatient Medications Prior to Visit  Medication Sig Dispense Refill  . citalopram (CELEXA) 10 MG tablet Take 1 tablet (10 mg total) by mouth daily. 90 tablet 0  . metoprolol-hydrochlorothiazide (LOPRESSOR HCT) 50-25 MG tablet Take 0.5 tablets by mouth daily. 45 tablet 0  . Multiple Vitamin (MULTIVITAMIN) tablet Take 1 tablet by mouth daily.    Marland Kitchen  phentermine 37.5 MG capsule Take 1 capsule by mouth in the morning 30 capsule 0   No facility-administered medications prior to visit.    Allergies  Allergen Reactions  . Lisinopril Other (See Comments)    Angioedema     Review of Systems  Constitutional: Negative for appetite change, chills and fever.  HENT: Positive for congestion, ear pain, rhinorrhea and sinus pain. Negative for sore throat, tinnitus and trouble swallowing.   Respiratory: Negative for cough and shortness of breath.   Cardiovascular: Negative for chest pain.  Gastrointestinal: Negative for abdominal pain, diarrhea, nausea and vomiting.  Musculoskeletal: Negative for arthralgias, myalgias, neck pain and neck stiffness.  Skin: Negative for rash.  Neurological:  Negative for dizziness and headaches.  Hematological: Negative for adenopathy.       Objective:    Physical Exam Constitutional:      Appearance: Normal appearance.  HENT:     Head: Normocephalic.     Right Ear: Tympanic membrane normal.     Left Ear: Ear canal and external ear normal. Tenderness present.     Nose: Nasal tenderness, congestion and rhinorrhea present. Rhinorrhea is clear.     Right Turbinates: Swollen.     Left Turbinates: Swollen.     Right Sinus: Frontal sinus tenderness present.     Left Sinus: Frontal sinus tenderness present.     Mouth/Throat:     Mouth: Mucous membranes are moist.     Pharynx: Oropharynx is clear.  Cardiovascular:     Rate and Rhythm: Normal rate and regular rhythm.     Pulses: Normal pulses.     Heart sounds: Normal heart sounds.  Pulmonary:     Effort: Pulmonary effort is normal.     Breath sounds: Normal breath sounds.  Abdominal:     Palpations: Abdomen is soft.  Musculoskeletal:     Cervical back: Neck supple.  Lymphadenopathy:     Cervical: No cervical adenopathy.  Skin:    General: Skin is warm and dry.  Neurological:     Mental Status: She is alert and oriented to person, place, and time.     LMP 04/17/2020  Wt Readings from Last 3 Encounters:  04/28/20 264 lb (119.7 kg)  03/31/20 264 lb (119.7 kg)  12/08/19 273 lb (123.8 kg)    Health Maintenance Due  Topic Date Due  . Hepatitis C Screening  Never done  . COVID-19 Vaccine (1) Never done  . HIV Screening  Never done  . TETANUS/TDAP  Never done    There are no preventive care reminders to display for this patient.   Lab Results  Component Value Date   TSH 1.220 03/31/2020   Lab Results  Component Value Date   WBC 13.6 (H) 03/31/2020   HGB 11.4 03/31/2020   HCT 36.1 03/31/2020   MCV 87 03/31/2020   PLT 414 03/31/2020   Lab Results  Component Value Date   NA 139 03/31/2020   K 3.8 03/31/2020   CO2 26 03/31/2020   GLUCOSE 85 03/31/2020   BUN 10  03/31/2020   CREATININE 0.78 03/31/2020   BILITOT 0.3 03/31/2020   ALKPHOS 87 03/31/2020   AST 13 03/31/2020   ALT 13 03/31/2020   PROT 6.6 03/31/2020   ALBUMIN 3.8 03/31/2020   CALCIUM 8.9 03/31/2020   Lab Results  Component Value Date   CHOL 139 03/31/2020   Lab Results  Component Value Date   HDL 43 03/31/2020   Lab Results  Component Value Date  [place] 81 03/31/2020   Lab Results  Component Value Date   TRIG 73 03/31/2020   Lab Results  Component Value Date   CHOLHDL 3.2 03/31/2020   No results found for: HGBA1C     Assessment & Plan:  1. Acute non-recurrent frontal sinusitis  2. Seasonal allergic rhinitis due to other allergic trigger  Take Tylenol or Ibuprofen over-the-counter for left ear pain and Mucinex over-the-counter for sinus symptoms.  Use Flonase nasal spray twice a day continually for seasonal allergies Take Amoxicillin twice a day with food for 10 days.  Notify clinic if symptoms worsen or fail to improve in 48 hours               Rip Harbour, NP

## 2020-05-06 ENCOUNTER — Other Ambulatory Visit: Payer: Self-pay

## 2020-05-06 DIAGNOSIS — R799 Abnormal finding of blood chemistry, unspecified: Secondary | ICD-10-CM

## 2020-05-19 ENCOUNTER — Encounter: Payer: Self-pay | Admitting: Family Medicine

## 2020-06-01 ENCOUNTER — Other Ambulatory Visit: Payer: Self-pay | Admitting: Family Medicine

## 2020-06-03 ENCOUNTER — Other Ambulatory Visit: Payer: Self-pay | Admitting: Hematology and Oncology

## 2020-06-03 DIAGNOSIS — D72829 Elevated white blood cell count, unspecified: Secondary | ICD-10-CM

## 2020-06-03 LAB — CBC AND DIFFERENTIAL
HCT: 39 (ref 36–46)
Hemoglobin: 12.5 (ref 12.0–16.0)
Neutrophils Absolute: 10.58
Platelets: 388 (ref 150–399)
WBC: 14.7

## 2020-06-03 LAB — CBC: RBC: 4.49 (ref 3.87–5.11)

## 2020-07-11 ENCOUNTER — Ambulatory Visit: Payer: 59 | Admitting: Family Medicine

## 2020-07-14 ENCOUNTER — Encounter: Payer: Self-pay | Admitting: Nurse Practitioner

## 2020-07-14 ENCOUNTER — Other Ambulatory Visit: Payer: Self-pay

## 2020-07-14 ENCOUNTER — Ambulatory Visit: Payer: 59 | Admitting: Nurse Practitioner

## 2020-07-14 VITALS — BP 130/84 | HR 82 | Temp 97.5°F | Ht 65.0 in | Wt 256.2 lb

## 2020-07-14 DIAGNOSIS — Z23 Encounter for immunization: Secondary | ICD-10-CM

## 2020-07-14 DIAGNOSIS — I10 Essential (primary) hypertension: Secondary | ICD-10-CM

## 2020-07-14 DIAGNOSIS — J3089 Other allergic rhinitis: Secondary | ICD-10-CM | POA: Diagnosis not present

## 2020-07-14 DIAGNOSIS — F339 Major depressive disorder, recurrent, unspecified: Secondary | ICD-10-CM | POA: Diagnosis not present

## 2020-07-14 DIAGNOSIS — Z6841 Body Mass Index (BMI) 40.0 and over, adult: Secondary | ICD-10-CM

## 2020-07-14 LAB — COMPREHENSIVE METABOLIC PANEL
ALT: 12 IU/L (ref 0–32)
AST: 15 IU/L (ref 0–40)
Albumin/Globulin Ratio: 1.2 (ref 1.2–2.2)
Albumin: 3.9 g/dL (ref 3.8–4.8)
Alkaline Phosphatase: 89 IU/L (ref 44–121)
BUN/Creatinine Ratio: 12 (ref 9–23)
BUN: 10 mg/dL (ref 6–20)
Bilirubin Total: 0.3 mg/dL (ref 0.0–1.2)
CO2: 25 mmol/L (ref 20–29)
Calcium: 9.3 mg/dL (ref 8.7–10.2)
Chloride: 100 mmol/L (ref 96–106)
Creatinine, Ser: 0.83 mg/dL (ref 0.57–1.00)
GFR calc Af Amer: 103 mL/min/{1.73_m2} (ref >59)
GFR calc non Af Amer: 90 mL/min/{1.73_m2} (ref >59)
Globulin, Total: 3.2 g/dL (ref 1.5–4.5)
Glucose: 94 mg/dL (ref 65–99)
Potassium: 4.2 mmol/L (ref 3.5–5.2)
Sodium: 138 mmol/L (ref 134–144)
Total Protein: 7.1 g/dL (ref 6.0–8.5)

## 2020-07-14 LAB — CBC WITH DIFFERENTIAL/PLATELET
Basophils Absolute: 0.1 10*3/uL (ref 0.0–0.2)
Basos: 1 %
EOS (ABSOLUTE): 0.3 10*3/uL (ref 0.0–0.4)
Eos: 2 %
Hematocrit: 36.8 % (ref 34.0–46.6)
Hemoglobin: 12 g/dL (ref 11.1–15.9)
Immature Grans (Abs): 0 10*3/uL (ref 0.0–0.1)
Immature Granulocytes: 0 %
Lymphocytes Absolute: 2.8 10*3/uL (ref 0.7–3.1)
Lymphs: 22 %
MCH: 28 pg (ref 26.6–33.0)
MCHC: 32.6 g/dL (ref 31.5–35.7)
MCV: 86 fL (ref 79–97)
Monocytes Absolute: 1.2 10*3/uL — ABNORMAL HIGH (ref 0.1–0.9)
Monocytes: 9 %
Neutrophils Absolute: 8.6 10*3/uL — ABNORMAL HIGH (ref 1.4–7.0)
Neutrophils: 66 %
Platelets: 385 10*3/uL (ref 150–450)
RBC: 4.28 x10E6/uL (ref 3.77–5.28)
RDW: 13.4 % (ref 11.7–15.4)
WBC: 12.9 10*3/uL — ABNORMAL HIGH (ref 3.4–10.8)

## 2020-07-14 NOTE — Addendum Note (Signed)
Addended by: Oleta Mouse on: 07/14/2020 10:05 AM   Modules accepted: Orders

## 2020-07-14 NOTE — Patient Instructions (Signed)
Continue medications Contact [facility] (number provided) Follow-up in 6 months or sooner as needed Continue heart healthy diet Continue physical activity   https://www.cdc.gov/vaccines/hcp/vis/vis-statements/tdap.pdf">  Tdap (Tetanus, Diphtheria, Pertussis) Vaccine: What You Need to Know 1. Why get vaccinated? Tdap vaccine can prevent tetanus, diphtheria, and pertussis. Diphtheria and pertussis spread from person to person. Tetanus enters the body through cuts or wounds.  TETANUS (T) causes painful stiffening of the muscles. Tetanus can lead to serious health problems, including being unable to open the mouth, having trouble swallowing and breathing, or death.  DIPHTHERIA (D) can lead to difficulty breathing, heart failure, paralysis, or death.  PERTUSSIS (aP), also known as "whooping cough," can cause uncontrollable, violent coughing which makes it hard to breathe, eat, or drink. Pertussis can be extremely serious in babies and young children, causing pneumonia, convulsions, brain damage, or death. In teens and adults, it can cause weight loss, loss of bladder control, passing out, and rib fractures from severe coughing. 2. Tdap vaccine Tdap is only for children 7 years and older, adolescents, and adults.  Adolescents should receive a single dose of Tdap, preferably at age 43 or 39 years. Pregnant women should get a dose of Tdap during every pregnancy, to protect the newborn from pertussis. Infants are most at risk for severe, life-threatening complications from pertussis. Adults who have never received Tdap should get a dose of Tdap. Also, adults should receive a booster dose every 10 years, or earlier in the case of a severe and dirty wound or burn. Booster doses can be either Tdap or Td (a different vaccine that protects against tetanus and diphtheria but not pertussis). Tdap may be given at the same time as other vaccines. 3. Talk with your health care provider Tell your vaccine  provider if the person getting the vaccine:  Has had an allergic reaction after a previous dose of any vaccine that protects against tetanus, diphtheria, or pertussis, or has any severe, life-threatening allergies.  Has had a coma, decreased level of consciousness, or prolonged seizures within 7 days after a previous dose of any pertussis vaccine (DTP, DTaP, or Tdap).  Has seizures or another nervous system problem.  Has ever had Guillain-Barr Syndrome (also called GBS).  Has had severe pain or swelling after a previous dose of any vaccine that protects against tetanus or diphtheria. In some cases, your health care provider may decide to postpone Tdap vaccination to a future visit.  People with minor illnesses, such as a cold, may be vaccinated. People who are moderately or severely ill should usually wait until they recover before getting Tdap vaccine.  Your health care provider can give you more information. 4. Risks of a vaccine reaction  Pain, redness, or swelling where the shot was given, mild fever, headache, feeling tired, and nausea, vomiting, diarrhea, or stomachache sometimes happen after Tdap vaccine. People sometimes faint after medical procedures, including vaccination. Tell your provider if you feel dizzy or have vision changes or ringing in the ears.  As with any medicine, there is a very remote chance of a vaccine causing a severe allergic reaction, other serious injury, or death. 5. What if there is a serious problem? An allergic reaction could occur after the vaccinated person leaves the clinic. If you see signs of a severe allergic reaction (hives, swelling of the face and throat, difficulty breathing, a fast heartbeat, dizziness, or weakness), call 9-1-1 and get the person to the nearest hospital. For other signs that concern you, call your health care  provider.  Adverse reactions should be reported to the Vaccine Adverse Event Reporting System (VAERS). Your health care  provider will usually file this report, or you can do it yourself. Visit the VAERS website at www.vaers.SamedayNews.es or call (684)844-8357. VAERS is only for reporting reactions, and VAERS staff do not give medical advice. 6. The National Vaccine Injury Compensation Program The [employer] Vaccine Injury Compensation Program (VICP) is a federal program that was created to compensate people who may have been injured by certain vaccines. Visit the VICP website at GoldCloset.com.ee or call 807-371-5913 to learn about the program and about filing a claim. There is a time limit to file a claim for compensation. 7. How can I learn more?  Ask your health care provider.  Call your local or state health department.  Contact the Centers for Disease Control and Prevention (CDC): ? Call 980-100-4645 (1-800-CDC-INFO) or ? Visit CDC's website at http://hunter.com/ Vaccine Information Statement Tdap (Tetanus, Diphtheria, Pertussis) Vaccine (11/05/2018) This information is not intended to replace advice given to you by your health care provider. Make sure you discuss any questions you have with your health care provider. Document Revised: 11/14/2018 Document Reviewed: 11/17/2018 Elsevier Patient Education  [place].  Depression Screening Depression screening is a tool that your health care provider can use to learn if you have symptoms of depression. Depression is a common condition with many symptoms that are also often found in other conditions. Depression is treatable, but it must first be diagnosed. You may not know that certain feelings, thoughts, and behaviors that you are having can be symptoms of depression. Taking a depression screening test can help you and your health care provider decide if you need more assessment, or if you should be referred to a mental health care provider. What are the screening tests?  You may have a physical exam to see if another condition is  affecting your mental health. You may have a blood or urine sample taken during the physical exam.  You may be interviewed using a screening tool that was developed from research, such as one of these: ? Patient Health Questionnaire (PHQ). This is a set of either 2 or 9 questions. A health care provider who has been trained to score this screening test uses a guide to assess if your symptoms suggest that you may have depression. ? [place] Depression Rating Scale (HAM-D). This is a set of either 17 or 24 questions. You may be asked to take it again during or after your treatment, to see if your depression has gotten better. ? Beck Depression Inventory (BDI). This is a set of 21 multiple choice questions. Your health care provider scores your answers to assess:  Your level of depression, ranging from mild to severe.  Your response to treatment.  Your health care provider may talk with you about your daily activities, such as eating, sleeping, work, and recreation, and ask if you have had any changes in activity.  Your health care provider may ask you to see a mental health specialist, such as a psychiatrist or psychologist, for more evaluation. Who should be screened for depression?   All adults, including adults with a family history of a mental health disorder.  Managing Your Hypertension Hypertension is commonly called high blood pressure. This is when the force of your blood pressing against the walls of your arteries is too strong. Arteries are blood vessels that carry blood from your heart throughout your body. Hypertension forces the heart to work  harder to pump blood, and may cause the arteries to become narrow or stiff. Having untreated or uncontrolled hypertension can cause heart attack, stroke, kidney disease, and other problems. What are blood pressure readings? A blood pressure reading consists of a higher number over a lower number. Ideally, your blood pressure should be below  120/80. The first ("top") number is called the systolic pressure. It is a measure of the pressure in your arteries as your heart beats. The second ("bottom") number is called the diastolic pressure. It is a measure of the pressure in your arteries as the heart relaxes. What does my blood pressure reading mean? Blood pressure is classified into four stages. Based on your blood pressure reading, your health care provider may use the following stages to determine what type of treatment you need, if any. Systolic pressure and diastolic pressure are measured in a unit called mm Hg. Normal  Systolic pressure: below 182.  Diastolic pressure: below 80. Elevated  Systolic pressure: 993-716.  Diastolic pressure: below 80. Hypertension stage 1  Systolic pressure: 967-893.  Diastolic pressure: 81-01. Hypertension stage 2  Systolic pressure: 751 or above.  Diastolic pressure: 90 or above. What health risks are associated with hypertension? Managing your hypertension is an important responsibility. Uncontrolled hypertension can lead to:  A heart attack.  A stroke.  A weakened blood vessel (aneurysm).  Heart failure.  Kidney damage.  Eye damage.  Metabolic syndrome.  Memory and concentration problems. What changes can I make to manage my hypertension? Hypertension can be managed by making lifestyle changes and possibly by taking medicines. Your health care provider will help you make a plan to bring your blood pressure within a normal range. Eating and drinking   Eat a diet that is high in fiber and potassium, and low in salt (sodium), added sugar, and fat. An example eating plan is called the DASH (Dietary Approaches to Stop Hypertension) diet. To eat this way: ? Eat plenty of fresh fruits and vegetables. Try to fill half of your plate at each meal with fruits and vegetables. ? Eat whole grains, such as whole wheat pasta, brown rice, or whole grain bread. Fill about one quarter of  your plate with whole grains. ? Eat low-fat diary products. ? Avoid fatty cuts of meat, processed or cured meats, and poultry with skin. Fill about one quarter of your plate with lean proteins such as fish, chicken without skin, beans, eggs, and tofu. ? Avoid premade and processed foods. These tend to be higher in sodium, added sugar, and fat.  Reduce your daily sodium intake. Most people with hypertension should eat less than 1,500 mg of sodium a day.  Limit alcohol intake to no more than 1 drink a day for nonpregnant women and 2 drinks a day for men. One drink equals 12 oz of beer, 5 oz of wine, or 1 oz of hard liquor. Lifestyle  Work with your health care provider to maintain a healthy body weight, or to lose weight. Ask what an ideal weight is for you.  Get at least 30 minutes of exercise that causes your heart to beat faster (aerobic exercise) most days of the week. Activities may include walking, swimming, or biking.  Include exercise to strengthen your muscles (resistance exercise), such as weight lifting, as part of your weekly exercise routine. Try to do these types of exercises for 30 minutes at least 3 days a week.  Do not use any products that contain nicotine or tobacco, such as  cigarettes and e-cigarettes. If you need help quitting, ask your health care provider.  Control any long-term (chronic) conditions you have, such as high cholesterol or diabetes. Monitoring  Monitor your blood pressure at home as told by your health care provider. Your personal target blood pressure may vary depending on your medical conditions, your age, and other factors.  Have your blood pressure checked regularly, as often as told by your health care provider. Working with your health care provider  Review all the medicines you take with your health care provider because there may be side effects or interactions.  Talk with your health care provider about your diet, exercise habits, and other  lifestyle factors that may be contributing to hypertension.  Visit your health care provider regularly. Your health care provider can help you create and adjust your plan for managing hypertension. Will I need medicine to control my blood pressure? Your health care provider may prescribe medicine if lifestyle changes are not enough to get your blood pressure under control, and if:  Your systolic blood pressure is 130 or higher.  Your diastolic blood pressure is 80 or higher. Take medicines only as told by your health care provider. Follow the directions carefully. Blood pressure medicines must be taken as prescribed. The medicine does not work as well when you skip doses. Skipping doses also puts you at risk for problems. Contact a health care provider if:  You think you are having a reaction to medicines you have taken.  You have repeated (recurrent) headaches.  You feel dizzy.  You have swelling in your ankles.  You have trouble with your vision. Get help right away if:  You develop a severe headache or confusion.  You have unusual weakness or numbness, or you feel faint.  You have severe pain in your chest or abdomen.  You vomit repeatedly.  You have trouble breathing. Summary  Hypertension is when the force of blood pumping through your arteries is too strong. If this condition is not controlled, it may put you at risk for serious complications.  Your personal target blood pressure may vary depending on your medical conditions, your age, and other factors. For most people, a normal blood pressure is less than 120/80.  Hypertension is managed by lifestyle changes, medicines, or both. Lifestyle changes include weight loss, eating a healthy, low-sodium diet, exercising more, and limiting alcohol. This information is not intended to replace advice given to you by your health care provider. Make sure you discuss any questions you have with your health care provider. Document  Revised: 11/14/2018 Document Reviewed: 06/20/2016 Elsevier Patient Education  [place] DASH stands for "Dietary Approaches to Stop Hypertension." The DASH eating plan is a healthy eating plan that has been shown to reduce high blood pressure (hypertension). It may also reduce your risk for type 2 diabetes, heart disease, and stroke. The DASH eating plan may also help with weight loss. What are tips for following this plan?  General guidelines  Avoid eating more than 2,300 mg (milligrams) of salt (sodium) a day. If you have hypertension, you may need to reduce your sodium intake to 1,500 mg a day.  Limit alcohol intake to no more than 1 drink a day for nonpregnant women and 2 drinks a day for men. One drink equals 12 oz of beer, 5 oz of wine, or 1 oz of hard liquor.  Work with your health care provider to maintain a healthy body weight or to  lose weight. Ask what an ideal weight is for you.  Get at least 30 minutes of exercise that causes your heart to beat faster (aerobic exercise) most days of the week. Activities may include walking, swimming, or biking.  Work with your health care provider or diet and nutrition specialist (dietitian) to adjust your eating plan to your individual calorie needs. Reading food labels   Check food labels for the amount of sodium per serving. Choose foods with less than 5 percent of the Daily Value of sodium. Generally, foods with less than 300 mg of sodium per serving fit into this eating plan.  To find whole grains, look for the word "whole" as the first word in the ingredient list. Shopping  Buy products labeled as "low-sodium" or "no salt added."  Buy fresh foods. Avoid canned foods and premade or frozen meals. Cooking  Avoid adding salt when cooking. Use salt-free seasonings or herbs instead of table salt or sea salt. Check with your health care provider or pharmacist before using salt substitutes.  Do not fry foods.  Cook foods using healthy methods such as baking, boiling, grilling, and broiling instead.  Cook with heart-healthy oils, such as olive, canola, soybean, or sunflower oil. Meal planning  Eat a balanced diet that includes: ? 5 or more servings of fruits and vegetables each day. At each meal, try to fill half of your plate with fruits and vegetables. ? Up to 6-8 servings of whole grains each day. ? Less than 6 oz of lean meat, poultry, or fish each day. A 3-oz serving of meat is about the same size as a deck of cards. One egg equals 1 oz. ? 2 servings of low-fat dairy each day. ? A serving of nuts, seeds, or beans 5 times each week. ? Heart-healthy fats. Healthy fats called Omega-3 fatty acids are found in foods such as flaxseeds and coldwater fish, like sardines, salmon, and mackerel.  Limit how much you eat of the following: ? Canned or prepackaged foods. ? Food that is high in trans fat, such as fried foods. ? Food that is high in saturated fat, such as fatty meat. ? Sweets, desserts, sugary drinks, and other foods with added sugar. ? Full-fat dairy products.  Do not salt foods before eating.  Try to eat at least 2 vegetarian meals each week.  Eat more home-cooked food and less restaurant, buffet, and fast food.  When eating at a restaurant, ask that your food be prepared with less salt or no salt, if possible. What foods are recommended? The items listed may not be a complete list. Talk with your dietitian about what dietary choices are best for you. Grains Whole-grain or whole-wheat bread. Whole-grain or whole-wheat pasta. Brown rice. [place]. Bulgur. Whole-grain and low-sodium cereals. Pita bread. Low-fat, low-sodium crackers. Whole-wheat flour tortillas. Vegetables Fresh or frozen vegetables (raw, steamed, roasted, or grilled). Low-sodium or reduced-sodium tomato and vegetable juice. Low-sodium or reduced-sodium tomato sauce and tomato paste. Low-sodium or reduced-sodium  canned vegetables. Fruits All fresh, dried, or frozen fruit. Canned fruit in natural juice (without added sugar). Meat and other protein foods Skinless chicken or [place]. Ground chicken or [place]. Pork with fat trimmed off. Fish and seafood. Egg whites. Dried beans, peas, or lentils. Unsalted nuts, nut butters, and seeds. Unsalted canned beans. Lean cuts of beef with fat trimmed off. Low-sodium, lean deli meat. Dairy Low-fat (1%) or fat-free (skim) milk. Fat-free, low-fat, or reduced-fat cheeses. Nonfat, low-sodium ricotta or cottage cheese. Low-fat or  nonfat yogurt. Low-fat, low-sodium cheese. Fats and oils Soft margarine without trans fats. Vegetable oil. Low-fat, reduced-fat, or light mayonnaise and salad dressings (reduced-sodium). Canola, safflower, olive, soybean, and sunflower oils. Avocado. Seasoning and other foods Herbs. Spices. Seasoning mixes without salt. Unsalted popcorn and pretzels. Fat-free sweets. What foods are not recommended? The items listed may not be a complete list. Talk with your dietitian about what dietary choices are best for you. Grains Baked goods made with fat, such as croissants, muffins, or some breads. Dry pasta or rice meal packs. Vegetables Creamed or fried vegetables. Vegetables in a cheese sauce. Regular canned vegetables (not low-sodium or reduced-sodium). Regular canned tomato sauce and paste (not low-sodium or reduced-sodium). Regular tomato and vegetable juice (not low-sodium or reduced-sodium). Angie Fava. Olives. Fruits Canned fruit in a light or heavy syrup. Fried fruit. Fruit in cream or butter sauce. Meat and other protein foods Fatty cuts of meat. Ribs. Fried meat. Berniece Salines. Sausage. Bologna and other processed lunch meats. Salami. Fatback. Hotdogs. Bratwurst. Salted nuts and seeds. Canned beans with added salt. Canned or smoked fish. Whole eggs or egg yolks. Chicken or [place] with skin. Dairy Whole or 2% milk, cream, and half-and-half. Whole or  full-fat cream cheese. Whole-fat or sweetened yogurt. Full-fat cheese. Nondairy creamers. Whipped toppings. Processed cheese and cheese spreads. Fats and oils Butter. Stick margarine. Lard. Shortening. Ghee. Bacon fat. Tropical oils, such as coconut, palm kernel, or palm oil. Seasoning and other foods Salted popcorn and pretzels. Onion salt, garlic salt, seasoned salt, table salt, and sea salt. Worcestershire sauce. Tartar sauce. Barbecue sauce. Teriyaki sauce. Soy sauce, including reduced-sodium. Steak sauce. Canned and packaged gravies. Fish sauce. Oyster sauce. Cocktail sauce. Horseradish that you find on the shelf. Ketchup. Mustard. Meat flavorings and tenderizers. Bouillon cubes. Hot sauce and Tabasco sauce. Premade or packaged marinades. Premade or packaged taco seasonings. Relishes. Regular salad dressings. Where to find more information:  National Heart, Lung, and [place]: https://wilson-eaton.com/  American Heart Association: www.heart.org Summary  The DASH eating plan is a healthy eating plan that has been shown to reduce high blood pressure (hypertension). It may also reduce your risk for type 2 diabetes, heart disease, and stroke.  With the DASH eating plan, you should limit salt (sodium) intake to 2,300 mg a day. If you have hypertension, you may need to reduce your sodium intake to 1,500 mg a day.  When on the DASH eating plan, aim to eat more fresh fruits and vegetables, whole grains, lean proteins, low-fat dairy, and heart-healthy fats.  Work with your health care provider or diet and nutrition specialist (dietitian) to adjust your eating plan to your individual calorie needs. This information is not intended to replace advice given to you by your health care provider. Make sure you discuss any questions you have with your health care provider. Document Revised: 07/05/2017 Document Reviewed: 07/16/2016 Elsevier Patient Education  [place] DASH  stands for "Dietary Approaches to Stop Hypertension." The DASH eating plan is a healthy eating plan that has been shown to reduce high blood pressure (hypertension). It may also reduce your risk for type 2 diabetes, heart disease, and stroke. The DASH eating plan may also help with weight loss. What are tips for following this plan?  General guidelines Avoid eating more than 2,300 mg (milligrams) of salt (sodium) a day. If you have hypertension, you may need to reduce your sodium intake to 1,500 mg a day. Limit alcohol intake to no more than 1  drink a day for nonpregnant women and 2 drinks a day for men. One drink equals 12 oz of beer, 5 oz of wine, or 1 oz of hard liquor. Work with your health care provider to maintain a healthy body weight or to lose weight. Ask what an ideal weight is for you. Get at least 30 minutes of exercise that causes your heart to beat faster (aerobic exercise) most days of the week. Activities may include walking, swimming, or biking. Work with your health care provider or diet and nutrition specialist (dietitian) to adjust your eating plan to your individual calorie needs. Reading food labels  Check food labels for the amount of sodium per serving. Choose foods with less than 5 percent of the Daily Value of sodium. Generally, foods with less than 300 mg of sodium per serving fit into this eating plan. To find whole grains, look for the word "whole" as the first word in the ingredient list. Shopping Buy products labeled as "low-sodium" or "no salt added." Buy fresh foods. Avoid canned foods and premade or frozen meals. Cooking Avoid adding salt when cooking. Use salt-free seasonings or herbs instead of table salt or sea salt. Check with your health care provider or pharmacist before using salt substitutes. Do not fry foods. Cook foods using healthy methods such as baking, boiling, grilling, and broiling instead. Cook with heart-healthy oils, such as olive, canola,  soybean, or sunflower oil. Meal planning Eat a balanced diet that includes: 5 or more servings of fruits and vegetables each day. At each meal, try to fill half of your plate with fruits and vegetables. Up to 6-8 servings of whole grains each day. Less than 6 oz of lean meat, poultry, or fish each day. A 3-oz serving of meat is about the same size as a deck of cards. One egg equals 1 oz. 2 servings of low-fat dairy each day. A serving of nuts, seeds, or beans 5 times each week. Heart-healthy fats. Healthy fats called Omega-3 fatty acids are found in foods such as flaxseeds and coldwater fish, like sardines, salmon, and mackerel. Limit how much you eat of the following: Canned or prepackaged foods. Food that is high in trans fat, such as fried foods. Food that is high in saturated fat, such as fatty meat. Sweets, desserts, sugary drinks, and other foods with added sugar. Full-fat dairy products. Do not salt foods before eating. Try to eat at least 2 vegetarian meals each week. Eat more home-cooked food and less restaurant, buffet, and fast food. When eating at a restaurant, ask that your food be prepared with less salt or no salt, if possible. What foods are recommended? The items listed may not be a complete list. Talk with your dietitian about what dietary choices are best for you. Grains Whole-grain or whole-wheat bread. Whole-grain or whole-wheat pasta. Brown rice. [place]. Bulgur. Whole-grain and low-sodium cereals. Pita bread. Low-fat, low-sodium crackers. Whole-wheat flour tortillas. Vegetables Fresh or frozen vegetables (raw, steamed, roasted, or grilled). Low-sodium or reduced-sodium tomato and vegetable juice. Low-sodium or reduced-sodium tomato sauce and tomato paste. Low-sodium or reduced-sodium canned vegetables. Fruits All fresh, dried, or frozen fruit. Canned fruit in natural juice (without added sugar). Meat and other protein foods Skinless chicken or [place]. Ground  chicken or [place]. Pork with fat trimmed off. Fish and seafood. Egg whites. Dried beans, peas, or lentils. Unsalted nuts, nut butters, and seeds. Unsalted canned beans. Lean cuts of beef with fat trimmed off. Low-sodium, lean deli meat. Dairy Low-fat (  1%) or fat-free (skim) milk. Fat-free, low-fat, or reduced-fat cheeses. Nonfat, low-sodium ricotta or cottage cheese. Low-fat or nonfat yogurt. Low-fat, low-sodium cheese. Fats and oils Soft margarine without trans fats. Vegetable oil. Low-fat, reduced-fat, or light mayonnaise and salad dressings (reduced-sodium). Canola, safflower, olive, soybean, and sunflower oils. Avocado. Seasoning and other foods Herbs. Spices. Seasoning mixes without salt. Unsalted popcorn and pretzels. Fat-free sweets. What foods are not recommended? The items listed may not be a complete list. Talk with your dietitian about what dietary choices are best for you. Grains Baked goods made with fat, such as croissants, muffins, or some breads. Dry pasta or rice meal packs. Vegetables Creamed or fried vegetables. Vegetables in a cheese sauce. Regular canned vegetables (not low-sodium or reduced-sodium). Regular canned tomato sauce and paste (not low-sodium or reduced-sodium). Regular tomato and vegetable juice (not low-sodium or reduced-sodium). Angie Fava. Olives. Fruits Canned fruit in a light or heavy syrup. Fried fruit. Fruit in cream or butter sauce. Meat and other protein foods Fatty cuts of meat. Ribs. Fried meat. Berniece Salines. Sausage. Bologna and other processed lunch meats. Salami. Fatback. Hotdogs. Bratwurst. Salted nuts and seeds. Canned beans with added salt. Canned or smoked fish. Whole eggs or egg yolks. Chicken or [place] with skin. Dairy Whole or 2% milk, cream, and half-and-half. Whole or full-fat cream cheese. Whole-fat or sweetened yogurt. Full-fat cheese. Nondairy creamers. Whipped toppings. Processed cheese and cheese spreads. Fats and oils Butter. Stick margarine.  Lard. Shortening. Ghee. Bacon fat. Tropical oils, such as coconut, palm kernel, or palm oil. Seasoning and other foods Salted popcorn and pretzels. Onion salt, garlic salt, seasoned salt, table salt, and sea salt. Worcestershire sauce. Tartar sauce. Barbecue sauce. Teriyaki sauce. Soy sauce, including reduced-sodium. Steak sauce. Canned and packaged gravies. Fish sauce. Oyster sauce. Cocktail sauce. Horseradish that you find on the shelf. Ketchup. Mustard. Meat flavorings and tenderizers. Bouillon cubes. Hot sauce and Tabasco sauce. Premade or packaged marinades. Premade or packaged taco seasonings. Relishes. Regular salad dressings. Where to find more information: National Heart, Lung, and [place]: https://wilson-eaton.com/ American Heart Association: www.heart.org Summary The DASH eating plan is a healthy eating plan that has been shown to reduce high blood pressure (hypertension). It may also reduce your risk for type 2 diabetes, heart disease, and stroke. With the DASH eating plan, you should limit salt (sodium) intake to 2,300 mg a day. If you have hypertension, you may need to reduce your sodium intake to 1,500 mg a day. When on the DASH eating plan, aim to eat more fresh fruits and vegetables, whole grains, lean proteins, low-fat dairy, and heart-healthy fats. Work with your health care provider or diet and nutrition specialist (dietitian) to adjust your eating plan to your individual calorie needs. This information is not intended to replace advice given to you by your health care provider. Make sure you discuss any questions you have with your health care provider. Document Revised: 07/05/2017 Document Reviewed: 07/16/2016 Elsevier Patient Education  [place] who are 11-86 years old.  People who are recovering from a myocardial infarction (MI).  Pregnant women, or women who have given birth.  People who have a long-term (chronic) illness.  Anyone who has been  diagnosed with another type of a mental health disorder.  Anyone who has symptoms that could show depression. What do my results mean? Your health care provider will review the results of your depression screening, physical exam, and lab tests. Positive screens suggest that you may have depression. Screening is the  first step in getting the care that you may need. It is up to you to get your screening results. Ask your health care provider, or the department that is doing your screening tests, when your results will be ready. Talk with your health care provider about your results and diagnosis. A diagnosis of depression is made using the Diagnostic and Statistical Manual of Mental Disorders (DSM-V). This is a book that lists the number and type of symptoms that must be present for a health care provider to give a specific diagnosis.  Your health care provider may work with you to treat your symptoms of depression, or your health care provider may help you find a mental health provider who can assess, diagnose, and treat your depression. Get help right away if:  You have thoughts about hurting yourself or others. If you ever feel like you may hurt yourself or others, or have thoughts about taking your own life, get help right away. You can go to your nearest emergency department or call:  Your local emergency services (911 in the [place]  A suicide crisis helpline, such as the [place] at (807) 202-0736. This is open 24 hours a day. Summary  Depression screening is the first step in getting the help that you may need.  If your screening test shows symptoms of depression (is positive), your health care provider may ask you to see a mental health provider.  Anyone who is age 65 or older should be screened for depression. This information is not intended to replace advice given to you by your health care provider. Make sure you discuss any questions you have with your  health care provider. Document Revised: 07/05/2017 Document Reviewed: 12/07/2016 Elsevier Patient Education  [place].
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
## 2020-08-10 ENCOUNTER — Other Ambulatory Visit: Payer: Self-pay | Admitting: Physician Assistant

## 2020-08-10 ENCOUNTER — Other Ambulatory Visit: Payer: Self-pay | Admitting: Family Medicine

## 2020-08-10 DIAGNOSIS — I10 Essential (primary) hypertension: Secondary | ICD-10-CM

## 2020-08-25 ENCOUNTER — Other Ambulatory Visit: Payer: Self-pay | Admitting: Nurse Practitioner

## 2020-08-25 ENCOUNTER — Encounter: Payer: Self-pay | Admitting: Nurse Practitioner

## 2020-08-25 ENCOUNTER — Telehealth (INDEPENDENT_AMBULATORY_CARE_PROVIDER_SITE_OTHER): Payer: 59 | Admitting: Nurse Practitioner

## 2020-08-25 VITALS — Temp 95.6°F

## 2020-08-25 DIAGNOSIS — Z20828 Contact with and (suspected) exposure to other viral communicable diseases: Secondary | ICD-10-CM

## 2020-08-25 DIAGNOSIS — R059 Cough, unspecified: Secondary | ICD-10-CM

## 2020-08-25 DIAGNOSIS — Z20822 Contact with and (suspected) exposure to covid-19: Secondary | ICD-10-CM

## 2020-08-25 DIAGNOSIS — J020 Streptococcal pharyngitis: Secondary | ICD-10-CM

## 2020-08-25 DIAGNOSIS — J069 Acute upper respiratory infection, unspecified: Secondary | ICD-10-CM | POA: Diagnosis not present

## 2020-08-25 DIAGNOSIS — J029 Acute pharyngitis, unspecified: Secondary | ICD-10-CM

## 2020-08-25 DIAGNOSIS — J3489 Other specified disorders of nose and nasal sinuses: Secondary | ICD-10-CM

## 2020-08-25 LAB — POCT RAPID STREP A (OFFICE): Rapid Strep A Screen: POSITIVE — AB

## 2020-08-25 LAB — POC COVID19 BINAXNOW: SARS Coronavirus 2 Ag: NEGATIVE

## 2020-08-25 MED ORDER — AMOXICILLIN 500 MG PO CAPS: 500.0000 mg | ORAL_CAPSULE | Freq: Two times a day (BID) | ORAL | 0 refills | Status: DC

## 2020-08-25 MED ORDER — BENZONATATE 100 MG PO CAPS: 200.0000 mg | ORAL_CAPSULE | Freq: Three times a day (TID) | ORAL | 0 refills | Status: DC | PRN

## 2020-08-25 MED ORDER — FLUTICASONE PROPIONATE 50 MCG/ACT NA SUSP: 2.0000 | Freq: Every day | NASAL | 6 refills | Status: DC

## 2020-08-25 NOTE — Progress Notes (Signed)
Virtual Visit via Telephone Note   This visit type was conducted due to national recommendations for restrictions regarding the COVID-19 Pandemic (e.g. social distancing) in an effort to limit this patient's exposure and mitigate transmission in our community.  Due to her co-morbid illnesses, this patient is at least at moderate risk for complications without adequate follow up.  This format is felt to be most appropriate for this patient at this time.  The patient did not have access to video technology/had technical difficulties with video requiring transitioning to audio format only (telephone).  All issues noted in this document were discussed and addressed.  No physical exam could be performed with this format.  Patient verbally consented to a telehealth visit.   Date:  08/25/2020   ID:  Rebekah Barnes, DOB 20-May-1981, MRN [id number]  Patient Location: Home Provider Location: Office/Clinic  PCP:  Rip Harbour, NP   Evaluation Performed: Established patient, acute telemedicine  Chief Complaint:  Sore throat  History of Present Illness:    Rebekah Barnes is a 40 y.o. female with symptoms of sore throat, nasal congestion, post-nasal-drip, and cough. Onset of symptoms was 3-days ago. Treatment includes Mucinex, Flonase, hot tea, and warm salt water gargles. She has received two COVID-19 and seasonal flu vaccines. 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 She has requested COVID-19 testing today.   The patient does have symptoms concerning for COVID-19 infection (fever, chills, cough, or new shortness of breath).    Past Medical History:  Diagnosis Date  . Essential (primary) hypertension   . Fibroid   . Morbid (severe) obesity due to excess calories ([place])     No past surgical history on file.  Family History  Problem Relation Age of Onset  . Hypertension Father   . Renal Disease Father     Social History   Socioeconomic History  .  Marital status: Single    Spouse name: Not on file  . Number of children: Not on file  . Years of education: Not on file  . Highest education level: Not on file  Occupational History  . Not on file  Tobacco Use  . Smoking status: Never Smoker  . Smokeless tobacco: Never Used  Vaping Use  . Vaping Use: Never used  Substance and Sexual Activity  . Alcohol use: Yes    Comment: Social  . Drug use: Never  . Sexual activity: Not Currently    Comment: 1st intercourse 40 yo-Fewer than 5 partners  Other Topics Concern  . Not on file  Social History Narrative  . Not on file   Social Determinants of Health   Financial Resource Strain: Not on file  Food Insecurity: Not on file  Transportation Needs: Not on file  Physical Activity: Not on file  Stress: Not on file  Social Connections: Not on file  Intimate Partner Violence: Not on file    Outpatient Medications Prior to Visit  Medication Sig Dispense Refill  . citalopram (CELEXA) 10 MG tablet Take 1 tablet (10 mg total) by mouth daily. 90 tablet 0  . fluticasone (FLONASE) 50 MCG/ACT nasal spray Place 2 sprays into both nostrils daily. 16 g 6  . Mefenamic Acid 250 MG CAPS Take 1 capsule by mouth every 6 (six) hours as needed.    . metoprolol-hydrochlorothiazide (LOPRESSOR HCT) 50-25 MG tablet Take 1 tablet by mouth once daily 30 tablet 0  . Multiple Vitamin (MULTIVITAMIN) tablet Take 1 tablet by mouth daily.    Marland Kitchen  phentermine 37.5 MG capsule Take 1 capsule by mouth in the morning 30 capsule 0   No facility-administered medications prior to visit.    Allergies:   Lisinopril   Social History   Tobacco Use  . Smoking status: Never Smoker  . Smokeless tobacco: Never Used  Vaping Use  . Vaping Use: Never used  Substance Use Topics  . Alcohol use: Yes    Comment: Social  . Drug use: Never     Review of Systems  Constitutional: Positive for chills and malaise/fatigue. Negative for fever.  HENT: Positive for congestion and  sore throat.        Sneezing  Respiratory: Positive for cough. Negative for sputum production and shortness of breath.   Cardiovascular: Positive for chest pain.  Gastrointestinal: Positive for nausea (due to post-nasal drip). Negative for diarrhea and vomiting.  Musculoskeletal: Negative for joint pain and myalgias.  Neurological: Positive for headaches. Negative for dizziness.  Psychiatric/Behavioral: Positive for depression (improved with medication). The patient is not nervous/anxious.      Labs/Other Tests and Data Reviewed:    Recent Labs: 03/31/2020: TSH 1.220 07/14/2020: ALT 12; BUN 10; Creatinine, Ser 0.83; Hemoglobin 12.0; Platelets 385; Potassium 4.2; Sodium 138   Recent Lipid Panel Lab Results  Component Value Date/Time   CHOL 139 03/31/2020 10:30 AM   TRIG 73 03/31/2020 10:30 AM   HDL 43 03/31/2020 10:30 AM   CHOLHDL 3.2 03/31/2020 10:30 AM   LDLCALC 81 03/31/2020 10:30 AM    Wt Readings from Last 3 Encounters:  07/14/20 256 lb 3.2 oz (116.2 kg)  05/05/20 261 lb (118.4 kg)  04/28/20 264 lb (119.7 kg)     Objective:    Vital Signs:  Temp (!) 95.6 F (35.3 C)    Physical Exam Vitals reviewed.    No physical exam due to telemedicine visit  ASSESSMENT & PLAN:    .  1. Strep pharyngitis - amoxicillin (AMOXIL) 500 MG capsule; Take 1 capsule (500 mg total) by mouth 2 (two) times daily.  Dispense: 20 capsule; Refill: 0  2. Upper respiratory tract infection, unspecified type  3. Sore throat - POC COVID-19 BinaxNow - POCT rapid strep A  4. Cough - POC COVID-19 BinaxNow - POCT rapid strep A - benzonatate (TESSALON) 100 MG capsule; Take 2 capsules (200 mg total) by mouth 3 (three) times daily as needed for cough.  Dispense: 30 capsule; Refill: 0  5. Close exposure to COVID-19 virus  Amoxicillin twice daily to 10 days Continue to isolate at home pending COVID-19 results Continue pushing fluids Continue Mucinex, Flonase, hot tea, and warm salt water  gargles Tessalon Perles 200 mg 3 times daily as needed for cough Replace toothbrushes and toothpaste in your home Notify office if symptoms fail to improve or worsen   COVID-19 Education: The signs and symptoms of COVID-19 were discussed with the patient and how to seek care for testing (follow up with PCP or arrange E-visit). The importance of social distancing was discussed today.   I spent 10 minutes dedicated to the care of this patient on the date of this encounter to include telepone time with the patient, as well as: EMR review and prescription medication management.   Follow Up:  Virtual Visit  prn  Signed,  Jerrell Belfast, DNP  08/25/2020 09:26    [place]

## 2020-10-05 ENCOUNTER — Other Ambulatory Visit: Payer: Self-pay | Admitting: Oncology

## 2020-10-05 DIAGNOSIS — D72829 Elevated white blood cell count, unspecified: Secondary | ICD-10-CM

## 2020-10-05 NOTE — Progress Notes (Signed)
[facility]  [address] [place],  [place]  [address] [phone]  Clinic Day:  10/06/2020  Referring physician: Rochel Brome, MD   HISTORY OF PRESENT ILLNESS:  The patient is a 40 y.o. female who I recently began seeing for mild leukocytosis.  At her initial visit, her elevated white count was attributed to her having a recent ear infection and being on steroids.  She comes in today to reassess her leukocytosis.  Since her last visit, the patient has been doing well  She had strep throat one month ago, from which she has recovered.  She denies having any B symptoms which concern her for her leukocytosis being due to a hematologic malignancy.  PHYSICAL EXAM:  Blood pressure (!) 182/101, pulse 79, temperature 98.1 F (36.7 C), resp. rate 16, height 5\' 5"  (1.651 m), weight 262 lb 4.8 oz (119 kg), SpO2 98 %. Wt Readings from Last 3 Encounters:  10/06/20 262 lb 4.8 oz (119 kg)  07/14/20 256 lb 3.2 oz (116.2 kg)  05/05/20 261 lb (118.4 kg)   Body mass index is 43.65 kg/m. Performance status (ECOG): 0 Physical Exam Constitutional:      Appearance: Normal appearance. She is not ill-appearing.  HENT:     Mouth/Throat:     Mouth: Mucous membranes are moist.     Pharynx: Oropharynx is clear. No oropharyngeal exudate or posterior oropharyngeal erythema.  Cardiovascular:     Rate and Rhythm: Normal rate and regular rhythm.     Heart sounds: No murmur heard. No friction rub. No gallop.   Pulmonary:     Effort: Pulmonary effort is normal. No respiratory distress.     Breath sounds: Normal breath sounds. No wheezing, rhonchi or rales.  Chest:  Breasts:     Right: No axillary adenopathy or supraclavicular adenopathy.     Left: No axillary adenopathy or supraclavicular adenopathy.    Abdominal:     General: Bowel sounds are normal. There is no distension.     Palpations: Abdomen is soft. There is no mass.     Tenderness: There is no abdominal  tenderness.  Musculoskeletal:        General: No swelling.     Right lower leg: No edema.     Left lower leg: No edema.  Lymphadenopathy:     Cervical: No cervical adenopathy.     Upper Body:     Right upper body: No supraclavicular or axillary adenopathy.     Left upper body: No supraclavicular or axillary adenopathy.     Lower Body: No right inguinal adenopathy. No left inguinal adenopathy.  Skin:    General: Skin is warm.     Coloration: Skin is not jaundiced.     Findings: No lesion or rash.  Neurological:     General: No focal deficit present.     Mental Status: She is alert and oriented to person, place, and time. Mental status is at baseline.     Cranial Nerves: Cranial nerves are intact.  Psychiatric:        Mood and Affect: Mood normal.        Behavior: Behavior normal.        Thought Content: Thought content normal.     LABS:   CBC Latest Ref Rng & Units 10/06/2020 07/14/2020 06/03/2020  WBC - 12.6 12.9(H) 14.7  Hemoglobin 12.0 - 16.0 12.5 12.0 12.5  Hematocrit 36 - 46 38 36.8 39  Platelets 150 - 399 326 385  388     ASSESSMENT & PLAN:   Assessment/Plan:  A 40 y.o. female with mild leukocytosis.  I am pleased as her white count is lower today than what it was at her last visit.  There remains nothing per her history or physical exam which suggests some type of ominous process is present.  Clinically, she is doing well.  I will see her back in 6 months for repeat clinical assessment.  If her white count is the same or better at that time, I will turn her care back over to her primary care office after her next visit.  The patient understands all the plans discussed today and is in agreement with them.    Noemy Hallmon Macarthur Critchley, MD

## 2020-10-06 ENCOUNTER — Other Ambulatory Visit: Payer: Self-pay | Admitting: Oncology

## 2020-10-06 ENCOUNTER — Inpatient Hospital Stay: Payer: 59 | Attending: Oncology

## 2020-10-06 ENCOUNTER — Other Ambulatory Visit: Payer: Self-pay

## 2020-10-06 ENCOUNTER — Other Ambulatory Visit: Payer: 59

## 2020-10-06 ENCOUNTER — Telehealth: Payer: Self-pay | Admitting: Oncology

## 2020-10-06 ENCOUNTER — Ambulatory Visit: Payer: 59 | Admitting: Oncology

## 2020-10-06 ENCOUNTER — Inpatient Hospital Stay (INDEPENDENT_AMBULATORY_CARE_PROVIDER_SITE_OTHER): Payer: 59 | Admitting: Oncology

## 2020-10-06 ENCOUNTER — Other Ambulatory Visit: Payer: Self-pay | Admitting: Hematology and Oncology

## 2020-10-06 DIAGNOSIS — D72829 Elevated white blood cell count, unspecified: Secondary | ICD-10-CM

## 2020-10-06 LAB — CBC
MCV: 88 (ref 81–99)
RBC: 4.28 (ref 3.87–5.11)

## 2020-10-06 LAB — CBC AND DIFFERENTIAL
HCT: 38 (ref 36–46)
Hemoglobin: 12.5 (ref 12.0–16.0)
Neutrophils Absolute: 8.32
Platelets: 326 (ref 150–399)
WBC: 12.6

## 2020-10-06 NOTE — Telephone Encounter (Signed)
Per 3/3 LOS, patient scheduled for Sept Appt's.  Gave patient Appt Summary

## 2020-11-30 ENCOUNTER — Encounter: Payer: Self-pay | Admitting: Nurse Practitioner

## 2020-11-30 ENCOUNTER — Other Ambulatory Visit: Payer: Self-pay

## 2020-11-30 ENCOUNTER — Ambulatory Visit (INDEPENDENT_AMBULATORY_CARE_PROVIDER_SITE_OTHER): Payer: 59 | Admitting: Nurse Practitioner

## 2020-11-30 VITALS — BP 138/74 | HR 92 | Temp 96.0°F | Ht 65.0 in | Wt 261.0 lb

## 2020-11-30 DIAGNOSIS — I10 Essential (primary) hypertension: Secondary | ICD-10-CM | POA: Diagnosis not present

## 2020-11-30 DIAGNOSIS — R42 Dizziness and giddiness: Secondary | ICD-10-CM

## 2020-11-30 MED ORDER — MECLIZINE HCL 25 MG PO TABS: 25.0000 mg | ORAL_TABLET | Freq: Three times a day (TID) | ORAL | 0 refills | Status: DC | PRN

## 2020-11-30 MED ORDER — METOPROLOL TARTRATE 50 MG PO TABS: 50.0000 mg | ORAL_TABLET | Freq: Every day | ORAL | 3 refills | Status: DC

## 2020-11-30 MED ORDER — HYDROCHLOROTHIAZIDE 25 MG PO TABS: 25.0000 mg | ORAL_TABLET | Freq: Every day | ORAL | 0 refills | Status: DC

## 2020-11-30 NOTE — Progress Notes (Signed)
Acute Office Visit  Subjective:    Patient ID: Rebekah Barnes, female    DOB: September 09, 1980, 40 y.o.   MRN: [id number]  CC: Dizziness   HPI Rebekah Barnes is a 40 year old [place] American female that presents for evaluation of dizziness. Onset was 3-days go. Treatment included eating food but did not alleviate symptoms.Dizziness has currently subsided. She tells me that she had not been taking her antihypertensive medications due to financial constraints. She was prescribed Metoprolol-HCTZ 50/25. States her medications increased from $3 to $30 unexpectedly. She added that she had a stomach virus a week ago that caused vomiting and diarrhea. She tried to push fluids and stay hydrated.   Past Medical History:  Diagnosis Date  . Essential (primary) hypertension   . Fibroid   . Morbid (severe) obesity due to excess calories [facility])     History reviewed. No pertinent surgical history.  Family History  Problem Relation Age of Onset  . Hypertension Father   . Renal Disease Father     Social History   Socioeconomic History  . Marital status: Single    Spouse name: Not on file  . Number of children: Not on file  . Years of education: Not on file  . Highest education level: Not on file  Occupational History  . Not on file  Tobacco Use  . Smoking status: Never Smoker  . Smokeless tobacco: Never Used  Vaping Use  . Vaping Use: Never used  Substance and Sexual Activity  . Alcohol use: Yes    Comment: Social  . Drug use: Never  . Sexual activity: Not Currently    Comment: 1st intercourse 40 yo-Fewer than 5 partners  Other Topics Concern  . Not on file  Social History Narrative  . Not on file   Social Determinants of Health   Financial Resource Strain: Not on file  Food Insecurity: Not on file  Transportation Needs: Not on file  Physical Activity: Not on file  Stress: Not on file  Social Connections: Not on file  Intimate Partner Violence: Not on file    Outpatient Medications  Prior to Visit  Medication Sig Dispense Refill  . citalopram (CELEXA) 10 MG tablet Take 1 tablet (10 mg total) by mouth daily. 90 tablet 0  . fluticasone (FLONASE) 50 MCG/ACT nasal spray Place 2 sprays into both nostrils daily. 16 g 6  . metoprolol-hydrochlorothiazide (LOPRESSOR HCT) 50-25 MG tablet Take 1 tablet by mouth once daily 30 tablet 0  . Multiple Vitamin (MULTIVITAMIN) tablet Take 1 tablet by mouth daily.    Marland Kitchen amoxicillin (AMOXIL) 500 MG capsule Take 1 capsule (500 mg total) by mouth 2 (two) times daily. 20 capsule 0  . benzonatate (TESSALON) 100 MG capsule Take 2 capsules (200 mg total) by mouth 3 (three) times daily as needed for cough. 30 capsule 0  . Mefenamic Acid 250 MG CAPS Take 1 capsule by mouth every 6 (six) hours as needed.    . phentermine 37.5 MG capsule Take 1 capsule by mouth in the morning 30 capsule 0   No facility-administered medications prior to visit.    Allergies  Allergen Reactions  . Lisinopril Other (See Comments)    Angioedema     Review of Systems  Constitutional: Negative for chills, fatigue and fever.  HENT: Negative for congestion, ear pain, rhinorrhea and sore throat.   Respiratory: Negative for cough and shortness of breath.   Cardiovascular: Negative for chest pain.  Gastrointestinal: Negative for abdominal pain, constipation, diarrhea, nausea  and vomiting.  Genitourinary: Negative for dysuria and urgency.  Musculoskeletal: Negative for back pain and myalgias.  Allergic/Immunologic: Positive for environmental allergies.  Neurological: Positive for dizziness. Negative for weakness, light-headedness and headaches.  Psychiatric/Behavioral: Negative for dysphoric mood. The patient is not nervous/anxious.        Objective:    Physical Exam Vitals reviewed.  Constitutional:      Appearance: Normal appearance.  HENT:     Head: Normocephalic.     Right Ear: Tympanic membrane normal.     Left Ear: Tympanic membrane normal.     Nose: Nose  normal.     Mouth/Throat:     Mouth: Mucous membranes are moist.  Eyes:     Pupils: Pupils are equal, round, and reactive to light.  Cardiovascular:     Rate and Rhythm: Normal rate and regular rhythm.     Pulses: Normal pulses.     Heart sounds: Normal heart sounds.  Pulmonary:     Effort: Pulmonary effort is normal.     Breath sounds: Normal breath sounds.  Abdominal:     General: Bowel sounds are normal.     Palpations: Abdomen is soft.  Musculoskeletal:        General: Normal range of motion.     Cervical back: Neck supple.  Skin:    General: Skin is warm and dry.     Capillary Refill: Capillary refill takes less than 2 seconds.  Neurological:     General: No focal deficit present.     Mental Status: She is alert and oriented to person, place, and time.  Psychiatric:        Mood and Affect: Mood normal.        Behavior: Behavior normal.     BP 138/74   Pulse 92   Temp (!) 96 F (35.6 C)   Ht 5\' 5"  (1.651 m)   Wt 261 lb (118.4 kg)   SpO2 98%   BMI 43.43 kg/m  Wt Readings from Last 3 Encounters:  11/30/20 261 lb (118.4 kg)  10/06/20 262 lb 4.8 oz (119 kg)  07/14/20 256 lb 3.2 oz (116.2 kg)    Health Maintenance Due  Topic Date Due  . Hepatitis C Screening  Never done  . HIV Screening  Never done  . COVID-19 Vaccine (3 - Booster for Pfizer series) 05/06/2020     Lab Results  Component Value Date   TSH 1.220 03/31/2020   Lab Results  Component Value Date   WBC 12.6 10/06/2020   HGB 12.5 10/06/2020   HCT 38 10/06/2020   MCV 88 10/06/2020   PLT 326 10/06/2020   Lab Results  Component Value Date   NA 138 07/14/2020   K 4.2 07/14/2020   CO2 25 07/14/2020   GLUCOSE 94 07/14/2020   BUN 10 07/14/2020   CREATININE 0.83 07/14/2020   BILITOT 0.3 07/14/2020   ALKPHOS 89 07/14/2020   AST 15 07/14/2020   ALT 12 07/14/2020   PROT 7.1 07/14/2020   ALBUMIN 3.9 07/14/2020   CALCIUM 9.3 07/14/2020   Lab Results  Component Value Date   CHOL 139  03/31/2020   Lab Results  Component Value Date   HDL 43 03/31/2020   Lab Results  Component Value Date   LDLCALC 81 03/31/2020   Lab Results  Component Value Date   TRIG 73 03/31/2020   Lab Results  Component Value Date   CHOLHDL 3.2 03/31/2020        Assessment &  Plan:     1. Essential hypertension - metoprolol tartrate (LOPRESSOR) 50 MG tablet; Take 1 tablet (50 mg total) by mouth daily.  Dispense: 90 tablet; Refill: 3 - hydrochlorothiazide (HYDRODIURIL) 25 MG tablet; Take 1 tablet (25 mg total) by mouth daily.  Dispense: 90 tablet; Refill: 0  2. Vertigo - meclizine (ANTIVERT) 25 MG tablet; Take 1 tablet (25 mg total) by mouth 3 (three) times daily as needed for dizziness.  Dispense: 90 tablet; Refill: 0  3. Dizziness - Comprehensive metabolic panel - meclizine (ANTIVERT) 25 MG tablet; Take 1 tablet (25 mg total) by mouth 3 (three) times daily as needed for dizziness.  Dispense: 90 tablet; Refill: 0  Take Meclizine 25 mg as needed for vertigo Do Epley Exercises as needed for vertigo Take BP medications daily as directed DASH diet Follow-up as needed   I, Rip Harbour, NP, have reviewed all documentation for this visit. The documentation on 11/30/20 for the exam, diagnosis, procedures, and orders are all accurate and complete.     Follow-up: As needed   Signed Jerrell Belfast, DNP

## 2020-11-30 NOTE — Patient Instructions (Signed)
Take Meclizine 25 mg as needed for vertigo Do Epley Exercises as needed for vertigo Take BP medications daily as directed DASH diet Follow-up as needed   [email or web address]">  DASH Eating Plan DASH stands for Dietary Approaches to Stop Hypertension. The DASH eating plan is a healthy eating plan that has been shown to:  Reduce high blood pressure (hypertension).  Reduce your risk for type 2 diabetes, heart disease, and stroke.  Help with weight loss. What are tips for following this plan? Reading food labels  Check food labels for the amount of salt (sodium) per serving. Choose foods with less than 5 percent of the Daily Value of sodium. Generally, foods with less than 300 milligrams (mg) of sodium per serving fit into this eating plan.  To find whole grains, look for the word "whole" as the first word in the ingredient list. Shopping  Buy products labeled as "low-sodium" or "no salt added."  Buy fresh foods. Avoid canned foods and pre-made or frozen meals. Cooking  Avoid adding salt when cooking. Use salt-free seasonings or herbs instead of table salt or sea salt. Check with your health care provider or pharmacist before using salt substitutes.  Do not fry foods. Cook foods using healthy methods such as baking, boiling, grilling, roasting, and broiling instead.  Cook with heart-healthy oils, such as olive, canola, avocado, soybean, or sunflower oil. Meal planning  Eat a balanced diet that includes: ? 4 or more servings of fruits and 4 or more servings of vegetables each day. Try to fill one-half of your plate with fruits and vegetables. ? 6-8 servings of whole grains each day. ? Less than 6 oz (170 g) of lean meat, poultry, or fish each day. A 3-oz (85-g) serving of meat is about the same size as a deck of cards. One egg equals 1 oz (28 g). ? 2-3 servings of low-fat dairy each day. One serving is 1 cup (237 mL). ? 1 serving of  nuts, seeds, or beans 5 times each week. ? 2-3 servings of heart-healthy fats. Healthy fats called omega-3 fatty acids are found in foods such as walnuts, flaxseeds, fortified milks, and eggs. These fats are also found in cold-water fish, such as sardines, salmon, and mackerel.  Limit how much you eat of: ? Canned or prepackaged foods. ? Food that is high in trans fat, such as some fried foods. ? Food that is high in saturated fat, such as fatty meat. ? Desserts and other sweets, sugary drinks, and other foods with added sugar. ? Full-fat dairy products.  Do not salt foods before eating.  Do not eat more than 4 egg yolks a week.  Try to eat at least 2 vegetarian meals a week.  Eat more home-cooked food and less restaurant, buffet, and fast food.   Lifestyle  When eating at a restaurant, ask that your food be prepared with less salt or no salt, if possible.  If you drink alcohol: ? Limit how much you use to:  0-1 drink a day for women who are not pregnant.  0-2 drinks a day for men. ? Be aware of how much alcohol is in your drink. In the [place] one drink equals one 12 oz bottle of beer (355 mL), one 5 oz glass of wine (148 mL), or one 1 oz glass of hard liquor (44 mL). General information  Avoid eating more than 2,300 mg of salt a day. If you have hypertension, you may need to reduce your sodium intake to  1,500 mg a day.  Work with your health care provider to maintain a healthy body weight or to lose weight. Ask what an ideal weight is for you.  Get at least 30 minutes of exercise that causes your heart to beat faster (aerobic exercise) most days of the week. Activities may include walking, swimming, or biking.  Work with your health care provider or dietitian to adjust your eating plan to your individual calorie needs. What foods should I eat? Fruits All fresh, dried, or frozen fruit. Canned fruit in natural juice (without added sugar). Vegetables Fresh or frozen vegetables  (raw, steamed, roasted, or grilled). Low-sodium or reduced-sodium tomato and vegetable juice. Low-sodium or reduced-sodium tomato sauce and tomato paste. Low-sodium or reduced-sodium canned vegetables. Grains Whole-grain or whole-wheat bread. Whole-grain or whole-wheat pasta. Brown rice. [place]. Bulgur. Whole-grain and low-sodium cereals. Pita bread. Low-fat, low-sodium crackers. Whole-wheat flour tortillas. Meats and other proteins Skinless chicken or [place]. Ground chicken or [place]. Pork with fat trimmed off. Fish and seafood. Egg whites. Dried beans, peas, or lentils. Unsalted nuts, nut butters, and seeds. Unsalted canned beans. Lean cuts of beef with fat trimmed off. Low-sodium, lean precooked or cured meat, such as sausages or meat loaves. Dairy Low-fat (1%) or fat-free (skim) milk. Reduced-fat, low-fat, or fat-free cheeses. Nonfat, low-sodium ricotta or cottage cheese. Low-fat or nonfat yogurt. Low-fat, low-sodium cheese. Fats and oils Soft margarine without trans fats. Vegetable oil. Reduced-fat, low-fat, or light mayonnaise and salad dressings (reduced-sodium). Canola, safflower, olive, avocado, soybean, and sunflower oils. Avocado. Seasonings and condiments Herbs. Spices. Seasoning mixes without salt. Other foods Unsalted popcorn and pretzels. Fat-free sweets. The items listed above may not be a complete list of foods and beverages you can eat. Contact a dietitian for more information. What foods should I avoid? Fruits Canned fruit in a light or heavy syrup. Fried fruit. Fruit in cream or butter sauce. Vegetables Creamed or fried vegetables. Vegetables in a cheese sauce. Regular canned vegetables (not low-sodium or reduced-sodium). Regular canned tomato sauce and paste (not low-sodium or reduced-sodium). Regular tomato and vegetable juice (not low-sodium or reduced-sodium). Angie Fava. Olives. Grains Baked goods made with fat, such as croissants, muffins, or some breads. Dry pasta  or rice meal packs. Meats and other proteins Fatty cuts of meat. Ribs. Fried meat. Berniece Salines. Bologna, salami, and other precooked or cured meats, such as sausages or meat loaves. Fat from the back of a pig (fatback). Bratwurst. Salted nuts and seeds. Canned beans with added salt. Canned or smoked fish. Whole eggs or egg yolks. Chicken or [place] with skin. Dairy Whole or 2% milk, cream, and half-and-half. Whole or full-fat cream cheese. Whole-fat or sweetened yogurt. Full-fat cheese. Nondairy creamers. Whipped toppings. Processed cheese and cheese spreads. Fats and oils Butter. Stick margarine. Lard. Shortening. Ghee. Bacon fat. Tropical oils, such as coconut, palm kernel, or palm oil. Seasonings and condiments Onion salt, garlic salt, seasoned salt, table salt, and sea salt. Worcestershire sauce. Tartar sauce. Barbecue sauce. Teriyaki sauce. Soy sauce, including reduced-sodium. Steak sauce. Canned and packaged gravies. Fish sauce. Oyster sauce. Cocktail sauce. Store-bought horseradish. Ketchup. Mustard. Meat flavorings and tenderizers. Bouillon cubes. Hot sauces. Pre-made or packaged marinades. Pre-made or packaged taco seasonings. Relishes. Regular salad dressings. Other foods Salted popcorn and pretzels. The items listed above may not be a complete list of foods and beverages you should avoid. Contact a dietitian for more information. Where to find more information  National Heart, Lung, and Blood Institute: https://wilson-eaton.com/  American Heart Association: www.heart.org  Academy of Nutrition and Dietetics: www.eatright.[place]: www.kidney.org Summary  The DASH eating plan is a healthy eating plan that has been shown to reduce high blood pressure (hypertension). It may also reduce your risk for type 2 diabetes, heart disease, and stroke.  When on the DASH eating plan, aim to eat more fresh fruits and vegetables, whole grains, lean proteins, low-fat dairy, and  heart-healthy fats.  With the DASH eating plan, you should limit salt (sodium) intake to 2,300 mg a day. If you have hypertension, you may need to reduce your sodium intake to 1,500 mg a day.  Work with your health care provider or dietitian to adjust your eating plan to your individual calorie needs. This information is not intended to replace advice given to you by your health care provider. Make sure you discuss any questions you have with your health care provider. Document Revised: 06/26/2019 Document Reviewed: 06/26/2019 Elsevier Patient Education  2021 [place].  Managing Your Hypertension Hypertension, also called high blood pressure, is when the force of the blood pressing against the walls of the arteries is too strong. Arteries are blood vessels that carry blood from your heart throughout your body. Hypertension forces the heart to work harder to pump blood and may cause the arteries to become narrow or stiff. Understanding blood pressure readings Your personal target blood pressure may vary depending on your medical conditions, your age, and other factors. A blood pressure reading includes a higher number over a lower number. Ideally, your blood pressure should be below 120/80. You should know that:  The first, or top, number is called the systolic pressure. It is a measure of the pressure in your arteries as your heart beats.  The second, or bottom number, is called the diastolic pressure. It is a measure of the pressure in your arteries as the heart relaxes. Blood pressure is classified into four stages. Based on your blood pressure reading, your health care provider may use the following stages to determine what type of treatment you need, if any. Systolic pressure and diastolic pressure are measured in a unit called mmHg. Normal  Systolic pressure: below 123456.  Diastolic pressure: below 80. Elevated  Systolic pressure: [id number].  Diastolic pressure: below  80. Hypertension stage 1  Systolic pressure: 0000000.  Diastolic pressure: [id number]. Hypertension stage 2  Systolic pressure: [id number] or above.  Diastolic pressure: 90 or above. How can this condition affect me? Managing your hypertension is an important responsibility. Over time, hypertension can damage the arteries and decrease blood flow to important parts of the body, including the brain, heart, and kidneys. Having untreated or uncontrolled hypertension can lead to:  A heart attack.  A stroke.  A weakened blood vessel (aneurysm).  Heart failure.  Kidney damage.  Eye damage.  Metabolic syndrome.  Memory and concentration problems.  Vascular dementia. What actions can I take to manage this condition? Hypertension can be managed by making lifestyle changes and possibly by taking medicines. Your health care provider will help you make a plan to bring your blood pressure within a normal range. Nutrition  Eat a diet that is high in fiber and potassium, and low in salt (sodium), added sugar, and fat. An example eating plan is called the Dietary Approaches to Stop Hypertension (DASH) diet. To eat this way: ? Eat plenty of fresh fruits and vegetables. Try to fill one-half of your plate at each meal with fruits and vegetables. ? Eat whole grains, such as whole-wheat pasta,  brown rice, or whole-grain bread. Fill about one-fourth of your plate with whole grains. ? Eat low-fat dairy products. ? Avoid fatty cuts of meat, processed or cured meats, and poultry with skin. Fill about one-fourth of your plate with lean proteins such as fish, chicken without skin, beans, eggs, and tofu. ? Avoid pre-made and processed foods. These tend to be higher in sodium, added sugar, and fat.  Reduce your daily sodium intake. Most people with hypertension should eat less than 1,500 mg of sodium a day.   Lifestyle  Work with your health care provider to maintain a healthy body weight or to lose weight. Ask  what an ideal weight is for you.  Get at least 30 minutes of exercise that causes your heart to beat faster (aerobic exercise) most days of the week. Activities may include walking, swimming, or biking.  Include exercise to strengthen your muscles (resistance exercise), such as weight lifting, as part of your weekly exercise routine. Try to do these types of exercises for 30 minutes at least 3 days a week.  Do not use any products that contain nicotine or tobacco, such as cigarettes, e-cigarettes, and chewing tobacco. If you need help quitting, ask your health care provider.  Control any long-term (chronic) conditions you have, such as high cholesterol or diabetes.  Identify your sources of stress and find ways to manage stress. This may include meditation, deep breathing, or making time for fun activities.   Alcohol use  Do not drink alcohol if: ? Your health care provider tells you not to drink. ? You are pregnant, may be pregnant, or are planning to become pregnant.  If you drink alcohol: ? Limit how much you use to:  0-1 drink a day for women.  0-2 drinks a day for men. ? Be aware of how much alcohol is in your drink. In the [place] one drink equals one 12 oz bottle of beer (355 mL), one 5 oz glass of wine (148 mL), or one 1 oz glass of hard liquor (44 mL). Medicines Your health care provider may prescribe medicine if lifestyle changes are not enough to get your blood pressure under control and if:  Your systolic blood pressure is 130 or higher.  Your diastolic blood pressure is 80 or higher. Take medicines only as told by your health care provider. Follow the directions carefully. Blood pressure medicines must be taken as told by your health care provider. The medicine does not work as well when you skip doses. Skipping doses also puts you at risk for problems. Monitoring Before you monitor your blood pressure:  Do not smoke, drink caffeinated beverages, or exercise within 30  minutes before taking a measurement.  Use the bathroom and empty your bladder (urinate).  Sit quietly for at least 5 minutes before taking measurements. Monitor your blood pressure at home as told by your health care provider. To do this:  Sit with your back straight and supported.  Place your feet flat on the floor. Do not cross your legs.  Support your arm on a flat surface, such as a table. Make sure your upper arm is at heart level.  Each time you measure, take two or three readings one minute apart and record the results. You may also need to have your blood pressure checked regularly by your health care provider.   General information  Talk with your health care provider about your diet, exercise habits, and other lifestyle factors that may be contributing to hypertension.  Review all the medicines you take with your health care provider because there may be side effects or interactions.  Keep all visits as told by your health care provider. Your health care provider can help you create and adjust your plan for managing your high blood pressure. Where to find more information  National Heart, Lung, and Blood Institute: https://wilson-eaton.com/  American Heart Association: www.heart.org Contact a health care provider if:  You think you are having a reaction to medicines you have taken.  You have repeated (recurrent) headaches.  You feel dizzy.  You have swelling in your ankles.  You have trouble with your vision. Get help right away if:  You develop a severe headache or confusion.  You have unusual weakness or numbness, or you feel faint.  You have severe pain in your chest or abdomen.  You vomit repeatedly.  You have trouble breathing. These symptoms may represent a serious problem that is an emergency. Do not wait to see if the symptoms will go away. Get medical help right away. Call your local emergency services (911 in the [place] Do not drive yourself to the  hospital. Summary  Hypertension is when the force of blood pumping through your arteries is too strong. If this condition is not controlled, it may put you at risk for serious complications.  Your personal target blood pressure may vary depending on your medical conditions, your age, and other factors. For most people, a normal blood pressure is less than 120/80.  Hypertension is managed by lifestyle changes, medicines, or both.  Lifestyle changes to help manage hypertension include losing weight, eating a healthy, low-sodium diet, exercising more, stopping smoking, and limiting alcohol. This information is not intended to replace advice given to you by your health care provider. Make sure you discuss any questions you have with your health care provider. Document Revised: 08/28/2019 Document Reviewed: 06/23/2019 Elsevier Patient Education  2021 [place]. Vertigo Vertigo is the feeling that you or the things around you are moving when they are not. This feeling can come and go at any time. Vertigo often goes away on its own. This condition can be dangerous if it happens when you are doing activities like driving or working with machines. Your doctor will do tests to find the cause of your vertigo. These tests will also help your doctor decide on the best treatment for you. Follow these instructions at home: Eating and drinking  Drink enough fluid to keep your pee (urine) pale yellow.  Do not drink alcohol.      Activity  Return to your normal activities as told by your doctor. Ask your doctor what activities are safe for you.  In the morning, first sit up on the side of the bed. When you feel okay, stand slowly while you hold onto something until you know that your balance is fine.  Move slowly. Avoid sudden body or head movements or certain positions, as told by your doctor.  Use a cane if you have trouble standing or walking.  Sit down right away if you feel dizzy.  Avoid  doing any tasks or activities that can cause danger to you or others if you get dizzy.  Avoid bending down if you feel dizzy. Place items in your home so that they are easy for you to reach without leaning over.  Do not drive or use heavy machinery if you feel dizzy. General instructions  Take over-the-counter and prescription medicines only as told by your doctor.  Keep all follow-up visits as told by your doctor. This is important. Contact a doctor if:  Your medicine does not help your vertigo.  You have a fever.  Your problems get worse or you have new symptoms.  Your family or friends see changes in your behavior.  The feeling of being sick to your stomach gets worse.  Your vomiting gets worse.  You lose feeling (have numbness) in part of your body.  You feel prickling and tingling in a part of your body. Get help right away if:  You have trouble moving or talking.  You are always dizzy.  You pass out (faint).  You get very bad headaches.  You feel weak in your hands, arms, or legs.  You have changes in your hearing.  You have changes in how you see (vision).  You get a stiff neck.  Bright light starts to bother you. Summary  Vertigo is the feeling that you or the things around you are moving when they are not.  Your doctor will do tests to find the cause of your vertigo.  You may be told to avoid some tasks, positions, or movements.  Contact a doctor if your medicine is not helping, or if you have a fever, new symptoms, or a change in behavior.  Get help right away if you get very bad headaches, or if you have changes in how you speak, hear, or see. This information is not intended to replace advice given to you by your health care provider. Make sure you discuss any questions you have with your health care provider. Document Revised: 06/16/2018 Document Reviewed: 06/16/2018 Elsevier Patient Education  2021 [place]. How to Perform the Epley  Maneuver The Epley maneuver is an exercise that relieves symptoms of vertigo. Vertigo is the feeling that you or your surroundings are moving when they are not. When you feel vertigo, you may feel like the room is spinning and may have trouble walking. The Epley maneuver is used for a type of vertigo caused by a calcium deposit in a part of the inner ear. The maneuver involves changing head positions to help the deposit move out of the area. You can do this maneuver at home whenever you have symptoms of vertigo. You can repeat it in 24 hours if your vertigo has not gone away. Even though the Epley maneuver may relieve your vertigo for a few weeks, it is possible that your symptoms will return. This maneuver relieves vertigo, but it does not relieve dizziness. What are the risks? If it is done correctly, the Epley maneuver is considered safe. Sometimes it can lead to dizziness or nausea that goes away after a short time. If you develop other symptoms--such as changes in vision, weakness, or numbness--stop doing the maneuver and call your health care provider. Supplies needed:  A bed or table.  A pillow. How to do the Epley maneuver 1. Sit on the edge of a bed or table with your back straight and your legs extended or hanging over the edge of the bed or table. 2. Turn your head halfway toward the affected ear or side as told by your health care provider. 3. Lie backward quickly with your head turned until you are lying flat on your back. You may want to position a pillow under your shoulders. 4. Hold this position for at least 30 seconds. If you feel dizzy or have symptoms of vertigo, continue to hold the position until the symptoms stop. 5. Turn  your head to the opposite direction until your unaffected ear is facing the floor. 6. Hold this position for at least 30 seconds. If you feel dizzy or have symptoms of vertigo, continue to hold the position until the symptoms stop. 7. Turn your whole body to  the same side as your head so that you are positioned on your side. Your head will now be nearly facedown. Hold for at least 30 seconds. If you feel dizzy or have symptoms of vertigo, continue to hold the position until the symptoms stop. 8. Sit back up. You can repeat the maneuver in 24 hours if your vertigo does not go away.      Follow these instructions at home: For 24 hours after doing the Epley maneuver:  Keep your head in an upright position.  When lying down to sleep or rest, keep your head raised (elevated) with two or more pillows.  Avoid excessive neck movements. Activity  Do not drive or use machinery if you feel dizzy.  After doing the Epley maneuver, return to your normal activities as told by your health care provider. Ask your health care provider what activities are safe for you. General instructions  Drink enough fluid to keep your urine pale yellow.  Do not drink alcohol.  Take over-the-counter and prescription medicines only as told by your health care provider.  Keep all follow-up visits as told by your health care provider. This is important. Preventing vertigo symptoms Ask your health care provider if there is anything you should do at home to prevent vertigo. He or she may recommend that you:  Keep your head elevated with two or more pillows while you sleep.  Do not sleep on the side of your affected ear.  Get up slowly from bed.  Avoid sudden movements during the day.  Avoid extreme head positions or movement, such as looking up or bending over. Contact a health care provider if:  Your vertigo gets worse.  You have other symptoms, including: ? Nausea. ? Vomiting. ? Headache. Get help right away if you:  Have vision changes.  Have a headache or neck pain that is severe or getting worse.  Cannot stop vomiting.  Have new numbness or weakness in any part of your body. Summary  Vertigo is the feeling that you or your surroundings are  moving when they are not.  The Epley maneuver is an exercise that relieves symptoms of vertigo.  If the Epley maneuver is done correctly, it is considered safe and relieves vertigo quickly. This information is not intended to replace advice given to you by your health care provider. Make sure you discuss any questions you have with your health care provider. Document Revised: 05/20/2019 Document Reviewed: 05/20/2019 Elsevier Patient Education  2021 [employer].

## 2020-12-01 LAB — COMPREHENSIVE METABOLIC PANEL
ALT: 11 IU/L (ref 0–32)
AST: 16 IU/L (ref 0–40)
Albumin/Globulin Ratio: 1.2 (ref 1.2–2.2)
Albumin: 4 g/dL (ref 3.8–4.8)
Alkaline Phosphatase: 82 IU/L (ref 44–121)
BUN/Creatinine Ratio: 10 (ref 9–23)
BUN: 10 mg/dL (ref 6–20)
Bilirubin Total: 0.4 mg/dL (ref 0.0–1.2)
CO2: 19 mmol/L — ABNORMAL LOW (ref 20–29)
Calcium: 9 mg/dL (ref 8.7–10.2)
Chloride: 102 mmol/L (ref 96–106)
Creatinine, Ser: 1.03 mg/dL — ABNORMAL HIGH (ref 0.57–1.00)
Globulin, Total: 3.4 g/dL (ref 1.5–4.5)
Glucose: 82 mg/dL (ref 65–99)
Potassium: 4.7 mmol/L (ref 3.5–5.2)
Sodium: 139 mmol/L (ref 134–144)
Total Protein: 7.4 g/dL (ref 6.0–8.5)
eGFR: 71 mL/min/{1.73_m2} (ref >59)

## 2021-01-12 ENCOUNTER — Ambulatory Visit: Payer: 59 | Admitting: Nurse Practitioner

## 2021-01-26 ENCOUNTER — Ambulatory Visit: Payer: 59 | Admitting: Nurse Practitioner

## 2021-02-08 NOTE — Progress Notes (Signed)
Subjective:  Patient ID: Rebekah Barnes, female    DOB: November 26, 1980  Age: 40 y.o. MRN: [id number]  Chief Complaint  Patient presents with  . Hypertension  . Depression     HPI Rebekah Barnes is a 40 year old a African-American present for follow-up of hypertension and depression. She tells me that she has developed acne to her face since last visit. She denies new soaps, lotions, or detergents.    She was last seen for hypertension 3 months ago.  BP at that visit was 138/74. Management since that visit includes HCTZ 25 mg and Metoprolol 50 mg daily.  She reports excellent compliance with treatment. She is not having side effects.  She is following a Regular diet. She is exercising. She does not smoke.  Use of agents associated with hypertension: none.   Outside blood pressures are not being checked. Symptoms: No chest pain No chest pressure  No palpitations No syncope  No dyspnea No orthopnea  No paroxysmal nocturnal dyspnea No lower extremity edema   Pertinent labs: Lab Results  Component Value Date   CHOL 139 03/31/2020   HDL 43 03/31/2020   LDLCALC 81 03/31/2020   TRIG 73 03/31/2020   CHOLHDL 3.2 03/31/2020   Lab Results  Component Value Date   NA 139 11/30/2020   K 4.7 11/30/2020   CREATININE 1.03 (H) 11/30/2020   GFRNONAA 90 07/14/2020   GFRAA 103 07/14/2020   GLUCOSE 82 11/30/2020     The ASCVD Risk score (Goff DC Jr., et al., 2013) failed to calculate for the following reasons:   The 2013 ASCVD risk score is only valid for ages 56 to 75     Depression, Follow-up  She  was last seen for this 3 months ago. Changes made at last visit include Citalopram 10 mg daily.   She reports excellent compliance with treatment. She is not having side effects.   She reports excellent tolerance of treatment. Current symptoms include: fatigue She feels she is Improved since last visit.  Depression screen [facility] 2/9 02/09/2021 11/30/2020 07/14/2020  Decreased Interest 0 0 1   Down, Depressed, Hopeless 0 0 1  PHQ - 2 Score 0 0 2  Altered sleeping - 0 1  Tired, decreased energy - 0 1  Change in appetite - 0 0  Feeling bad or failure about yourself  - 0 1  Trouble concentrating - 0 1  Moving slowly or fidgety/restless - 0 0  Suicidal thoughts - 0 0  PHQ-9 Score - 0 6  Difficult doing work/chores - Not difficult at all Not difficult at all      Current Outpatient Medications on File Prior to Visit  Medication Sig Dispense Refill  . citalopram (CELEXA) 10 MG tablet Take 1 tablet (10 mg total) by mouth daily. 90 tablet 0  . fluticasone (FLONASE) 50 MCG/ACT nasal spray Place 2 sprays into both nostrils daily. 16 g 6  . hydrochlorothiazide (HYDRODIURIL) 25 MG tablet Take 1 tablet (25 mg total) by mouth daily. 90 tablet 0  . meclizine (ANTIVERT) 25 MG tablet Take 1 tablet (25 mg total) by mouth 3 (three) times daily as needed for dizziness. 90 tablet 0  . metoprolol tartrate (LOPRESSOR) 50 MG tablet Take 1 tablet (50 mg total) by mouth daily. 90 tablet 3  . metoprolol-hydrochlorothiazide (LOPRESSOR HCT) 50-25 MG tablet Take 1 tablet by mouth once daily 30 tablet 0  . Multiple Vitamin (MULTIVITAMIN) tablet Take 1 tablet by mouth daily.     No current  facility-administered medications on file prior to visit.   Past Medical History:  Diagnosis Date  . Essential (primary) hypertension   . Fibroid   . Morbid (severe) obesity due to excess calories ([place])    No past surgical history on file.  Family History  Problem Relation Age of Onset  . Hypertension Father   . Renal Disease Father    Social History   Socioeconomic History  . Marital status: Single    Spouse name: Not on file  . Number of children: Not on file  . Years of education: Not on file  . Highest education level: Not on file  Occupational History  . Not on file  Tobacco Use  . Smoking status: Never  . Smokeless tobacco: Never  Vaping Use  . Vaping Use: Never used  Substance and Sexual  Activity  . Alcohol use: Yes    Comment: Social  . Drug use: Never  . Sexual activity: Not Currently    Comment: 1st intercourse 40 yo-Fewer than 5 partners  Other Topics Concern  . Not on file  Social History Narrative  . Not on file   Social Determinants of Health   Financial Resource Strain: Not on file  Food Insecurity: Not on file  Transportation Needs: Not on file  Physical Activity: Not on file  Stress: Not on file  Social Connections: Not on file    Review of Systems  Constitutional:  Negative for appetite change, fatigue and fever.  HENT:  Negative for congestion, ear pain, sinus pressure and sore throat.   Eyes:  Negative for pain.  Respiratory:  Negative for cough, chest tightness, shortness of breath and wheezing.   Cardiovascular:  Negative for chest pain and palpitations.  Gastrointestinal:  Negative for abdominal pain, constipation, diarrhea, nausea and vomiting.  Genitourinary:  Negative for dysuria and hematuria.  Musculoskeletal:  Negative for arthralgias, back pain, joint swelling and myalgias.  Skin:  Negative for rash.  Neurological:  Negative for dizziness, weakness and headaches.  Psychiatric/Behavioral:  Negative for dysphoric mood. The patient is not nervous/anxious.     Objective:  Ht 5\' 5"  (1.651 m)   Wt 275 lb (124.7 kg)   BMI 45.76 kg/m   BP/Weight 02/09/2021 3/81/0175 1/0/2585  Systolic BP - 277 824  Diastolic BP - 74 235  Wt. (Lbs) 275 261 262.3  BMI 45.76 43.43 43.65    Physical Exam Vitals reviewed.  Constitutional:      Appearance: Normal appearance.  HENT:     Right Ear: Tympanic membrane, ear canal and external ear normal.     Left Ear: Tympanic membrane, ear canal and external ear normal.     Nose: Nose normal.     Mouth/Throat:     Mouth: Mucous membranes are moist.  Eyes:     Comments: Eyeglasses in place  Cardiovascular:     Rate and Rhythm: Normal rate and regular rhythm.     Pulses: Normal pulses.     Heart sounds:  Normal heart sounds.  Pulmonary:     Effort: Pulmonary effort is normal.     Breath sounds: Normal breath sounds.  Abdominal:     General: Bowel sounds are normal.     Palpations: Abdomen is soft.  Musculoskeletal:        General: Normal range of motion.     Cervical back: Normal range of motion.  Skin:    General: Skin is warm and dry.     Capillary Refill: Capillary refill takes  less than 2 seconds.     Findings: Lesion present.     Comments: Keloids to left earx1, right ear x2; cystic acne to forehead, chin  Neurological:     General: No focal deficit present.     Mental Status: She is alert and oriented to person, place, and time.  Psychiatric:        Mood and Affect: Mood normal.        Behavior: Behavior normal.        Thought Content: Thought content normal.        Judgment: Judgment normal.     Lab Results  Component Value Date   WBC 12.6 10/06/2020   HGB 12.5 10/06/2020   HCT 38 10/06/2020   PLT 326 10/06/2020   GLUCOSE 82 11/30/2020   CHOL 139 03/31/2020   TRIG 73 03/31/2020   HDL 43 03/31/2020   LDLCALC 81 03/31/2020   ALT 11 11/30/2020   AST 16 11/30/2020   NA 139 11/30/2020   K 4.7 11/30/2020   CL 102 11/30/2020   CREATININE 1.03 (H) 11/30/2020   BUN 10 11/30/2020   CO2 19 (L) 11/30/2020   TSH 1.220 03/31/2020      Assessment & Plan:     1. Essential hypertension-well controlled - CBC With Diff/Platelet - Comprehensive metabolic panel -continue HCTZ 25 mg and Metoprolol 50 mg daily -heart healthy diet -continue physical activity  2. Depression, recurrent (HCC) -Continue Citalopram 10 mg daily -PHQ-9=0 in office today  3. Cystic acne - clindamycin-benzoyl peroxide (BENZACLIN) gel; Apply topically 2 (two) times daily.  Dispense: 25 g; Refill: 0 -topical moisturizer daily  4. Other fatigue - Vitamin D, 25-hydroxy  5. Keloid of skin - Ambulatory referral to Dermatology   Continue medications Use good moisturizer daily to face  (recommend Cetaphil or Cera Ve') Vitamin B12 drops (sublingual) We will call you with lab results and dermatology appt for keloids on bilateral ears Use Benzaclin twice daily  Follow-up 38-months, fasting   Follow-up: 24-months, fasting  An After Visit Summary was printed and given to the patient.   I,Lauren M Auman,acting as a Education administrator for [employer], NP.,have documented all relevant documentation on the behalf of Rip Harbour, NP,as directed by  Rip Harbour, NP while in the presence of Rip Harbour, NP.   I, Rip Harbour, NP, have reviewed all documentation for this visit. The documentation on 02/09/21 for the exam, diagnosis, procedures, and orders are all accurate and complete.   Signed, Rip Harbour, NP [place] [phone]

## 2021-02-09 ENCOUNTER — Other Ambulatory Visit: Payer: Self-pay

## 2021-02-09 ENCOUNTER — Other Ambulatory Visit: Payer: Self-pay | Admitting: Nurse Practitioner

## 2021-02-09 ENCOUNTER — Ambulatory Visit (INDEPENDENT_AMBULATORY_CARE_PROVIDER_SITE_OTHER): Payer: 59 | Admitting: Nurse Practitioner

## 2021-02-09 ENCOUNTER — Encounter: Payer: Self-pay | Admitting: Nurse Practitioner

## 2021-02-09 VITALS — BP 124/84 | HR 90 | Ht 65.0 in | Wt 275.0 lb

## 2021-02-09 DIAGNOSIS — I10 Essential (primary) hypertension: Secondary | ICD-10-CM

## 2021-02-09 DIAGNOSIS — L700 Acne vulgaris: Secondary | ICD-10-CM

## 2021-02-09 DIAGNOSIS — R5383 Other fatigue: Secondary | ICD-10-CM | POA: Diagnosis not present

## 2021-02-09 DIAGNOSIS — L910 Hypertrophic scar: Secondary | ICD-10-CM

## 2021-02-09 DIAGNOSIS — F339 Major depressive disorder, recurrent, unspecified: Secondary | ICD-10-CM | POA: Diagnosis not present

## 2021-02-09 LAB — CBC WITH DIFF/PLATELET
Basophils Absolute: 0 10*3/uL (ref 0.0–0.2)
Basos: 0 %
EOS (ABSOLUTE): 0.3 10*3/uL (ref 0.0–0.4)
Eos: 3 %
Hematocrit: 36.3 % (ref 34.0–46.6)
Hemoglobin: 12 g/dL (ref 11.1–15.9)
Immature Grans (Abs): 0 10*3/uL (ref 0.0–0.1)
Immature Granulocytes: 0 %
Lymphocytes Absolute: 2.5 10*3/uL (ref 0.7–3.1)
Lymphs: 21 %
MCH: 28.9 pg (ref 26.6–33.0)
MCHC: 33.1 g/dL (ref 31.5–35.7)
MCV: 88 fL (ref 79–97)
Monocytes Absolute: 0.9 10*3/uL (ref 0.1–0.9)
Monocytes: 7 %
Neutrophils Absolute: 8.1 10*3/uL — ABNORMAL HIGH (ref 1.4–7.0)
Neutrophils: 69 %
Platelets: 370 10*3/uL (ref 150–450)
RBC: 4.15 x10E6/uL (ref 3.77–5.28)
RDW: 13 % (ref 11.7–15.4)
WBC: 11.9 10*3/uL — ABNORMAL HIGH (ref 3.4–10.8)

## 2021-02-09 LAB — COMPREHENSIVE METABOLIC PANEL
ALT: 15 IU/L (ref 0–32)
AST: 13 IU/L (ref 0–40)
Albumin/Globulin Ratio: 1.3 (ref 1.2–2.2)
Albumin: 4 g/dL (ref 3.8–4.8)
Alkaline Phosphatase: 92 IU/L (ref 44–121)
BUN/Creatinine Ratio: 10 (ref 9–23)
BUN: 9 mg/dL (ref 6–20)
Bilirubin Total: 0.2 mg/dL (ref 0.0–1.2)
CO2: 23 mmol/L (ref 20–29)
Calcium: 9 mg/dL (ref 8.7–10.2)
Chloride: 101 mmol/L (ref 96–106)
Creatinine, Ser: 0.87 mg/dL (ref 0.57–1.00)
Globulin, Total: 3.2 g/dL (ref 1.5–4.5)
Glucose: 88 mg/dL (ref 65–99)
Potassium: 4.3 mmol/L (ref 3.5–5.2)
Sodium: 137 mmol/L (ref 134–144)
Total Protein: 7.2 g/dL (ref 6.0–8.5)
eGFR: 87 mL/min/{1.73_m2} (ref >59)

## 2021-02-09 MED ORDER — METOPROLOL TARTRATE 50 MG PO TABS: 50.0000 mg | ORAL_TABLET | Freq: Every day | ORAL | 3 refills | Status: DC

## 2021-02-09 MED ORDER — CLINDAMYCIN PHOS-BENZOYL PEROX 1-5 % EX GEL: Freq: Two times a day (BID) | CUTANEOUS | 0 refills | Status: DC

## 2021-02-09 MED ORDER — HYDROCHLOROTHIAZIDE 25 MG PO TABS: 25.0000 mg | ORAL_TABLET | Freq: Every day | ORAL | 1 refills | Status: DC

## 2021-02-09 NOTE — Patient Instructions (Addendum)
Continue medications Use good moisturizer daily to face (recommend Cetaphil or Cera Ve') Vitamin B12 drops (sublingual) We will call you with lab results and dermatology appt for keloids on bilateral ears Use Benzaclin twice daily  Follow-up 63-months, fasting  Preventing Vitamin D Deficiency Vitamin D is a nutrient that helps your body absorb calcium from food. It plays a key role in the health of bones and teeth, muscle function, and infectionprevention. Our bodies make vitamin D when our skin is exposed to direct sunlight. However, for many people, this may not be enough vitamin D to meet the body's needs.When you get too little vitamin D, it is called a deficiency. How can this condition affect me? A vitamin D deficiency can put you at risk of developing conditions that cause bones to be brittle, such as rickets or osteoporosis. If you are over age 59, not having enough vitamin D may weaken your muscles and bones and increase yourrisk for falls and broken bones. What can increase my risk? You may be at risk for a vitamin D deficiency if you: Are pregnant. Are obese. Are over 23 years old. Have dark skin. Take certain medicines that affect the way vitamin D is absorbed. Have had gastric bypass surgery. Other risk factors include: Having a condition that limits your ability to absorb fat, such as cystic fibrosis, celiac disease, or inflammatory bowel disease. Having certain inherited conditions. Not having access to foods rich in vitamin D. Having limited ability to move. Living in areas that have fewer hours of sunlight. Spending most of your day indoors, or you cover your skin all the time when you are outdoors. Breastfed infants are also at risk for vitamin D deficiency. What actions can I take to reduce my risk of a vitamin D deficiency? Knowing the best sources of vitamin D You can meet your daily vitamin D needs from: Foods. Dietary supplements. Direct exposure to natural  sunlight. Infant formula (for babies). Knowing how much vitamin D you need General recommendations for daily vitamin D intake vary by these categories: Infants: 400 International Units. Children over 89 year old: 600 International Units. Adults: 600 International Units. Pregnant and breastfeeding women: 600 International Units. Adults over 61 years old: 4 International Units. These are minimum levels of recommended amounts. Your health care provider may recommend a different amount of vitamin D intake based on your specific needsand your overall health. Getting sun exposure Get regular, safe exposure to natural sunlight. Expose your skin to direct sunlight for at least 15 minutes every day. If you have dark skin, you may need to expose your skin for a longer period of time. Protect your skin from too much sun exposure. This helps to prevent skin cancer. Ask your health care provider if regular sun exposure is safe for you. Do not use a tanning bed. Eating and drinking  Eat foods that naturally contain vitamin D. These include: Beef liver. Egg yolk. Fatty fish, such as cod, salmon, trout, swordfish, shrimp, sardines, and tuna. Cheese. Mushrooms. Oysters. Eat or drink products that have been fortified with vitamin D. Fortified means that vitamin D has been added to the food. These may include: Cereals. Dairy products, such as milk, yogurt, butter, or margarine. Orange juice. Alternative milks, such as soy milk or almond milk. When choosing foods, check the food label on the package to see: How much vitamin D is in the item. If the food is fortified with vitamin D. Although it is hard to get your vitamin  D requirement from foods alone, you should eat a balanced diet each day that includes foods naturally higher in vitamin D or fortified with it. Try to include the following in your diet each day: 2-3 servings of meat or meat alternatives. 2-3 servings of dairy.  Taking  supplements If you are at risk for vitamin D deficiency, or if you have certain diseases, your health care provider may recommend that you take a vitamin D supplement. Make sure you: Talk with your health care provider before you start taking any vitamin D supplements. You may be more sensitive to the side effects of vitamin D supplements if you are on certain medicines or have certain medical conditions. Tell your health care provider about all medicines you are taking, including vitamin, mineral, and herbal supplements. Take medicines and supplements only as told by your health care provider. Summary Vitamin D is a nutrient that helps your body absorb calcium from food. A vitamin D deficiency can put you at risk of developing conditions that cause bones to be brittle, such as rickets or osteoporosis. Our bodies make vitamin D when our skin is exposed to direct sunlight. However, for many people, this may not be enough vitamin D to meet the body's needs. Some foods naturally contain vitamin D, including beef liver, egg yolk, and fatty fish. Products may also be fortified with vitamin D. Fortified means that vitamin D has been added to the food. This information is not intended to replace advice given to you by your health care provider. Make sure you discuss any questions you have with your healthcare provider. Document Revised: 04/15/2019 Document Reviewed: 07/18/2018 Elsevier Patient Education  2022 [place].  Acne  Acne is a skin problem that causes small, red bumps (pimples) and other skin changes. The skin has tiny holes called pores. Each pore has an oil gland. Acne happens when the pores get blocked. The pores may become red, sore, and swollen. They may also become infected. Acne is common amongteenagers. Acne usually goes away over time. What are the causes? This condition may be caused when: Oil glands get blocked by oil, dead skin cells, and dirt. Bacteria that live in the oil  glands increase in number and cause infection. Acne can start with changes in hormones. These changes can occur: When children mature into their teens (adolescence). When women get their period (menstrual cycle). When women are pregnant. Some things can make acne worse. They include: Cosmetics and hair products that have oil in them. Stress. Diseases that cause changes in hormones. Some medicines. Headbands, backpacks, or shoulder pads. Being near certain oils and chemicals. Foods that are high in sugars. These include dairy products, sweets, and chocolates. What increases the risk? You are more likely to develop this condition if: You are a teenager. You have a family history of acne. What are the signs or symptoms? Symptoms of this condition include: Small, red bumps (pimples or papules). Whiteheads. Blackheads. Small, pus-filled pimples (pustules). Big, red pimples or pustules that feel tender. Acne that is very bad can cause: An abscess. This is an area that has pus. Cysts. These are hard, painful sacs that have fluid. Scars. These can happen after large pimples heal. How is this treated? Treatment for this condition depends on how bad your acne is. It may include: Creams and lotions. These can: Keep the pores of your skin open. Prevent infections and swelling. Medicines that treat infections (antibiotics). These can be put on your skin or taken as  pills. Pills that decrease the amount of oil in your skin. Birth control pills. Light or laser treatments. Shots of medicine into the areas with acne. Chemicals that cause the skin to peel. Surgery. Follow these instructions at home: Good skin care is the most important thing you can do to treat your acne. Take care of your skin as told by your doctor. You may be told to do these things: Wash your skin gently at least two times each day. You should also wash your skin: After you exercise. Before you go to bed. Use mild  soap. Use a water-based skin moisturizer after you wash your skin. Use a sunscreen or sunblock with SPF 30 or greater. This is very important if you are using acne medicines. Choose cosmetics that will not block your oil glands (are noncomedogenic). Medicines Take over-the-counter and prescription medicines only as told by your doctor. If you were prescribed an antibiotic medicine, use it or take it as told by your doctor. Do not stop using the antibiotic even if your acne gets better. General instructions Keep your hair clean and off your face. Shampoo your hair on a regular basis. If you have oily hair, you may need to wash it every day. Avoid wearing tight headbands or hats. Avoid picking or squeezing your pimples. That can make your acne worse and cause it to scar. Shave gently. Only shave when you have to. Keep a food journal. This can help you see if any foods are linked to your acne. Keep all follow-up visits as told by your doctor. This is important. Contact a doctor if: Your acne is not better after eight weeks. Your acne gets worse. You have a large area of skin that is red or tender. You think that you are having side effects from any acne medicine. Summary Acne is a skin problem that causes pimples. Acne is common among teenagers. Acne usually goes away over time. Acne starts with changes in your hormones. Other causes include stress, diet, and some medicines. Follow your doctor's instructions on how to take care of your skin. Good skin care is the most important thing you can do to treat your acne. Take over-the-counter and prescription medicines only as told by your doctor. Contact your doctor if you think that you are having side effects from any acne medicine. This information is not intended to replace advice given to you by your health care provider. Make sure you discuss any questions you have with your healthcare provider. Document Revised: 12/03/2017 Document Reviewed:  12/03/2017 Elsevier Patient Education  [place].

## 2021-02-10 LAB — VITAMIN D 25 HYDROXY (VIT D DEFICIENCY, FRACTURES): Vit D, 25-Hydroxy: 19.4 ng/mL — ABNORMAL LOW (ref 30.0–100.0)

## 2021-02-13 ENCOUNTER — Other Ambulatory Visit: Payer: Self-pay

## 2021-02-13 MED ORDER — VITAMIN D (ERGOCALCIFEROL) 1.25 MG (50000 UNIT) PO CAPS: 50000.0000 [IU] | ORAL_CAPSULE | ORAL | 0 refills | Status: DC

## 2021-03-15 ENCOUNTER — Telehealth (INDEPENDENT_AMBULATORY_CARE_PROVIDER_SITE_OTHER): Payer: 59 | Admitting: Nurse Practitioner

## 2021-03-15 ENCOUNTER — Ambulatory Visit: Payer: 59 | Admitting: Nurse Practitioner

## 2021-03-15 ENCOUNTER — Encounter: Payer: Self-pay | Admitting: Nurse Practitioner

## 2021-03-15 DIAGNOSIS — R059 Cough, unspecified: Secondary | ICD-10-CM

## 2021-03-15 DIAGNOSIS — U071 COVID-19: Secondary | ICD-10-CM

## 2021-03-15 DIAGNOSIS — J029 Acute pharyngitis, unspecified: Secondary | ICD-10-CM | POA: Diagnosis not present

## 2021-03-15 LAB — POC COVID19 BINAXNOW: SARS Coronavirus 2 Ag: POSITIVE — AB

## 2021-03-15 LAB — POCT RAPID STREP A (OFFICE): Rapid Strep A Screen: NEGATIVE

## 2021-03-15 MED ORDER — NIRMATRELVIR/RITONAVIR (PAXLOVID)TABLET: 3.0000 | ORAL_TABLET | Freq: Two times a day (BID) | ORAL | 0 refills | Status: AC

## 2021-03-15 MED ORDER — PROMETHAZINE-DM 6.25-15 MG/5ML PO SYRP: 5.0000 mL | ORAL_SOLUTION | Freq: Four times a day (QID) | ORAL | 0 refills | Status: DC | PRN

## 2021-03-15 NOTE — Progress Notes (Signed)
Virtual Visit via Video Note   This visit type was conducted due to national recommendations for restrictions regarding the COVID-19 Pandemic (e.g. social distancing) in an effort to limit this patient's exposure and mitigate transmission in our community.  Due to her co-morbid illnesses, this patient is at least at moderate risk for complications without adequate follow up.  This format is felt to be most appropriate for this patient at this time.  All issues noted in this document were discussed and addressed.  A limited physical exam was performed with this format.  A verbal consent was obtained for the virtual visit.   Date:  03/15/2021   ID:  Rebekah Barnes, DOB Jul 05, 1981, MRN [id number]  Patient Location: Home Provider Location: Office/Clinic  PCP:  Rip Harbour, NP   Evaluation Performed:  Established patient, acute telemedicine visit  Chief Complaint:  sore throat  History of Present Illness:    Rebekah Barnes is a 40 y.o. female with sore throat, chills, sinus congestion, and cough. Onset of symptoms was yesterday. Treatment has included Mucinex and Tessalon Perles. Home COVID-19 test negative yesterday. She tells me that she was recently in close contact with COVID-19 positive co-worker. 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 She has obtained COVID-19 vaccinations. She has a past medical history of chronic allergic rhinitis.  The patient does have symptoms concerning for COVID-19 infection (fever, chills, cough, or new shortness of breath).    Past Medical History:  Diagnosis Date   Essential (primary) hypertension    Fibroid    Morbid (severe) obesity due to excess calories ([place])     No past surgical history on file.  Family History  Problem Relation Age of Onset   Hypertension Father    Renal Disease Father     Social History   Socioeconomic History   Marital status: Single    Spouse name: Not on file   Number of  children: Not on file   Years of education: Not on file   Highest education level: Not on file  Occupational History   Not on file  Tobacco Use   Smoking status: Never   Smokeless tobacco: Never  Vaping Use   Vaping Use: Never used  Substance and Sexual Activity   Alcohol use: Yes    Comment: Social   Drug use: Never   Sexual activity: Not Currently    Comment: 1st intercourse 40 yo-Fewer than 5 partners  Other Topics Concern   Not on file  Social History Narrative   Not on file   Social Determinants of Health   Financial Resource Strain: Not on file  Food Insecurity: Not on file  Transportation Needs: Not on file  Physical Activity: Not on file  Stress: Not on file  Social Connections: Not on file  Intimate Partner Violence: Not on file    Outpatient Medications Prior to Visit  Medication Sig Dispense Refill   citalopram (CELEXA) 10 MG tablet Take 1 tablet (10 mg total) by mouth daily. 90 tablet 0   clindamycin-benzoyl peroxide (BENZACLIN) gel Apply topically 2 (two) times daily. 25 g 0   fluticasone (FLONASE) 50 MCG/ACT nasal spray Place 2 sprays into both nostrils daily. 16 g 6   hydrochlorothiazide (HYDRODIURIL) 25 MG tablet Take 1 tablet (25 mg total) by mouth daily. 90 tablet 1   meclizine (ANTIVERT) 25 MG tablet Take 1 tablet (25 mg total) by mouth 3 (three) times daily as needed for dizziness. [place]  tablet 0   metoprolol tartrate (LOPRESSOR) 50 MG tablet Take 1 tablet (50 mg total) by mouth daily. 90 tablet 3   metoprolol-hydrochlorothiazide (LOPRESSOR HCT) 50-25 MG tablet Take 1 tablet by mouth once daily 30 tablet 0   Multiple Vitamin (MULTIVITAMIN) tablet Take 1 tablet by mouth daily.     Vitamin D, Ergocalciferol, (DRISDOL) 1.25 MG (50000 UNIT) CAPS capsule Take 1 capsule (50,000 Units total) by mouth every 7 (seven) days. 12 capsule 0   No facility-administered medications prior to visit.    Allergies:   Lisinopril   Social History   Tobacco Use   Smoking  status: Never   Smokeless tobacco: Never  Vaping Use   Vaping Use: Never used  Substance Use Topics   Alcohol use: Yes    Comment: Social   Drug use: Never     Review of Systems  Constitutional:  Positive for chills and malaise/fatigue.  HENT:  Positive for congestion, ear pain (left ear), sinus pain and sore throat.   Eyes: Negative.   Respiratory:  Positive for cough. Negative for sputum production, shortness of breath and wheezing.   Cardiovascular:  Negative for chest pain.  Gastrointestinal: Negative.   Genitourinary: Negative.   Musculoskeletal:  Positive for myalgias (generalized muscle aches).  Skin: Negative.   Neurological:  Positive for headaches.  Endo/Heme/Allergies:  Bruises/bleeds easily.  Psychiatric/Behavioral: Negative.      Labs/Other Tests and Data Reviewed:    Recent Labs: 03/31/2020: TSH 1.220 02/09/2021: ALT 15; BUN 9; Creatinine, Ser 0.87; Hemoglobin 12.0; Platelets 370; Potassium 4.3; Sodium 137   Recent Lipid Panel Lab Results  Component Value Date/Time   CHOL 139 03/31/2020 10:30 AM   TRIG 73 03/31/2020 10:30 AM   HDL 43 03/31/2020 10:30 AM   CHOLHDL 3.2 03/31/2020 10:30 AM   LDLCALC 81 03/31/2020 10:30 AM    Wt Readings from Last 3 Encounters:  02/09/21 275 lb (124.7 kg)  11/30/20 261 lb (118.4 kg)  10/06/20 262 lb 4.8 oz (119 kg)     Objective:    Vital Signs:  There were no vitals taken for this visit.   Physical Exam No physical exam performed due to telemedicine restaurant  ASSESSMENT & PLAN:    1. COVID-19 - nirmatrelvir/ritonavir EUA (PAXLOVID) TABS; Take 3 tablets by mouth 2 (two) times daily for 5 days. (Take nirmatrelvir 150 mg two tablets twice daily for 5 days and ritonavir 100 mg one tablet twice daily for 5 days) Patient GFR is 71  Dispense: 30 tablet; Refill: 0  2. Sore throat - POC COVID-19 - POCT rapid strep A  3. Cough - promethazine-dextromethorphan (PROMETHAZINE-DM) 6.25-15 MG/5ML syrup; Take 5 mLs by mouth 4  (four) times daily as needed for cough.  Dispense: 118 mL; Refill: 0    Rest and push fluids Tylenol/Ibuprofen as needed for pain Warm salt water gargles Replace toothbrush and toothpaste Use Flonase nasal spray and Claritin Continue Mucinex and Tessalon Perles  Orders Placed This Encounter  Procedures   POC COVID-19   POCT rapid strep A       COVID-19 Education: The signs and symptoms of COVID-19 were discussed with the patient and how to seek care for testing (follow up with PCP or arrange E-visit). The importance of social distancing was discussed today.   I spent 10 minutes dedicated to the care of this patient on the date of this encounter to include face-to-face time with the patient, as well as: EMR and prescription medication.  Follow Up:  In Person prn   I, Rip Harbour, NP, have reviewed all documentation for this visit. The documentation on 03/15/21 for the exam, diagnosis, procedures, and orders are all accurate and complete.      Signed, Rip Harbour, NP  03/15/2021 11:37 AM    [place]

## 2021-03-16 ENCOUNTER — Ambulatory Visit: Payer: 59 | Admitting: Nurse Practitioner

## 2021-03-30 NOTE — Progress Notes (Signed)
[facility]  [address] [place],  [place]  [address] [phone]  Clinic Day:  04/07/2021  Referring physician: Rip Harbour, NP  This document serves as a record of services personally performed by Marice Potter, MD. It was created on their behalf by Curry,Lauren E, a trained medical scribe. The creation of this record is based on the scribe's personal observations and the provider's statements to them.  HISTORY OF PRESENT ILLNESS:  The patient is a 40 y.o. female with mild leukocytosis.  She comes in today to reassess her leukocytosis.  Since her last visit, the patient has been doing fine.  Of note, she just recuperated from a COVID infection.  She denies having any B symptoms which concern her for her leukocytosis being due to a hematologic malignancy.  PHYSICAL EXAM:  Blood pressure (!) 178/80, temperature 98 F (36.7 C), temperature source Oral, resp. rate 20, weight 273 lb 4.8 oz (124 kg), SpO2 99 %. Wt Readings from Last 3 Encounters:  04/07/21 273 lb 4.8 oz (124 kg)  02/09/21 275 lb (124.7 kg)  11/30/20 261 lb (118.4 kg)   Body mass index is 45.48 kg/m. Performance status (ECOG): 0 Physical Exam Constitutional:      Appearance: Normal appearance. She is not ill-appearing.  HENT:     Mouth/Throat:     Mouth: Mucous membranes are moist.     Pharynx: Oropharynx is clear. No oropharyngeal exudate or posterior oropharyngeal erythema.  Cardiovascular:     Rate and Rhythm: Normal rate and regular rhythm.     Heart sounds: No murmur heard.   No friction rub. No gallop.  Pulmonary:     Effort: Pulmonary effort is normal. No respiratory distress.     Breath sounds: Normal breath sounds. No wheezing, rhonchi or rales.  Abdominal:     General: Bowel sounds are normal. There is no distension.     Palpations: Abdomen is soft. There is no mass.     Tenderness: There is no abdominal tenderness.  Musculoskeletal:        General: No  swelling.     Right lower leg: No edema.     Left lower leg: No edema.  Lymphadenopathy:     Cervical: No cervical adenopathy.     Upper Body:     Right upper body: No supraclavicular or axillary adenopathy.     Left upper body: No supraclavicular or axillary adenopathy.     Lower Body: No right inguinal adenopathy. No left inguinal adenopathy.  Skin:    General: Skin is warm.     Coloration: Skin is not jaundiced.     Findings: No lesion or rash.  Neurological:     General: No focal deficit present.     Mental Status: She is alert and oriented to person, place, and time. Mental status is at baseline.     Cranial Nerves: Cranial nerves are intact.  Psychiatric:        Mood and Affect: Mood normal.        Behavior: Behavior normal.        Thought Content: Thought content normal.    LABS:   CBC Latest Ref Rng & Units 04/07/2021 02/09/2021 10/06/2020  WBC - 13.0 11.9(H) 12.6  Hemoglobin 12.0 - 16.0 11.2(A) 12.0 12.5  Hematocrit 36 - 46 33(A) 36.3 38  Platelets 150 - 399 312 370 326    Ref. Range 04/07/2021 09:50  Iron Latest Ref Range: 28 - 170 ug/dL  51  UIBC Latest Units: ug/dL 287  TIBC Latest Ref Range: 250 - 450 ug/dL 338  Saturation Ratios Latest Ref Range: 10.4 - 31.8 % 15  Ferritin Latest Ref Range: 11 - 307 ng/mL 26  Folate Latest Ref Range: >5.9 ng/mL 11.1  Vitamin B12 Latest Ref Range: 180 - 914 pg/mL 1,415 (H)    ASSESSMENT & PLAN:  Assessment/Plan:  A 40 y.o. female with mild leukocytosis.  Her white count remains only minimally elevated.  There is nothing per her history or physical exam which suggests some type of ominous process is present.  However, her hemoglobin is lower than what it has been in the past.  Labs do not show any nutritional deficiencies being present. Both her leukopenia and anemia will continue to be followed over time.  I will see her back in 4 months for repeat clinical assessment.  The patient understands all the plans discussed today and is in  agreement with them.    I, Rita Ohara, am acting as scribe for Marice Potter, MD    I have reviewed this report as typed by the medical scribe, and it is complete and accurate.  Dequincy Macarthur Critchley, MD

## 2021-04-07 ENCOUNTER — Encounter: Payer: Self-pay | Admitting: Oncology

## 2021-04-07 ENCOUNTER — Telehealth: Payer: Self-pay | Admitting: Oncology

## 2021-04-07 ENCOUNTER — Inpatient Hospital Stay: Payer: 59 | Attending: Oncology

## 2021-04-07 ENCOUNTER — Other Ambulatory Visit: Payer: Self-pay

## 2021-04-07 ENCOUNTER — Inpatient Hospital Stay (INDEPENDENT_AMBULATORY_CARE_PROVIDER_SITE_OTHER): Payer: 59 | Admitting: Oncology

## 2021-04-07 ENCOUNTER — Other Ambulatory Visit: Payer: Self-pay | Admitting: Oncology

## 2021-04-07 ENCOUNTER — Other Ambulatory Visit: Payer: Self-pay | Admitting: Hematology and Oncology

## 2021-04-07 VITALS — BP 178/80 | Temp 98.0°F | Resp 20 | Wt 273.3 lb

## 2021-04-07 DIAGNOSIS — D649 Anemia, unspecified: Secondary | ICD-10-CM | POA: Insufficient documentation

## 2021-04-07 DIAGNOSIS — D72829 Elevated white blood cell count, unspecified: Secondary | ICD-10-CM

## 2021-04-07 DIAGNOSIS — D539 Nutritional anemia, unspecified: Secondary | ICD-10-CM

## 2021-04-07 LAB — VITAMIN B12: Vitamin B-12: 1415 pg/mL — ABNORMAL HIGH (ref 180–914)

## 2021-04-07 LAB — CBC AND DIFFERENTIAL
HCT: 33 — AB (ref 36–46)
Hemoglobin: 11.2 — AB (ref 12.0–16.0)
Neutrophils Absolute: 8.45
Platelets: 312 (ref 150–399)
WBC: 13

## 2021-04-07 LAB — FERRITIN: Ferritin: 26 ng/mL (ref 11–307)

## 2021-04-07 LAB — IRON AND TIBC
Iron: 51 ug/dL (ref 28–170)
Saturation Ratios: 15 % (ref 10.4–31.8)
TIBC: 338 ug/dL (ref 250–450)
UIBC: 287 ug/dL

## 2021-04-07 LAB — CBC: RBC: 3.83 — AB (ref 3.87–5.11)

## 2021-04-07 LAB — FOLATE: Folate: 11.1 ng/mL (ref >5.9)

## 2021-04-07 NOTE — Telephone Encounter (Signed)
Informed of lab results

## 2021-04-24 ENCOUNTER — Other Ambulatory Visit: Payer: Self-pay | Admitting: Nurse Practitioner

## 2021-05-19 ENCOUNTER — Encounter: Payer: Self-pay | Admitting: Nurse Practitioner

## 2021-05-19 ENCOUNTER — Encounter: Payer: Self-pay | Admitting: Family Medicine

## 2021-05-19 ENCOUNTER — Ambulatory Visit (INDEPENDENT_AMBULATORY_CARE_PROVIDER_SITE_OTHER): Payer: 59

## 2021-05-19 DIAGNOSIS — Z23 Encounter for immunization: Secondary | ICD-10-CM | POA: Diagnosis not present

## 2021-06-27 ENCOUNTER — Other Ambulatory Visit: Payer: Self-pay | Admitting: Nurse Practitioner

## 2021-06-27 DIAGNOSIS — R059 Cough, unspecified: Secondary | ICD-10-CM

## 2021-08-02 ENCOUNTER — Other Ambulatory Visit: Payer: Self-pay | Admitting: Nurse Practitioner

## 2021-08-11 ENCOUNTER — Ambulatory Visit: Payer: 59 | Admitting: Oncology

## 2021-08-11 ENCOUNTER — Other Ambulatory Visit: Payer: 59

## 2021-08-11 NOTE — Progress Notes (Incomplete)
[facility]  [address] [place],  [place]  [address] [phone]  Clinic Day:  08/11/2021  Referring physician: Rip Harbour, NP  This document serves as a record of services personally performed by Marice Potter, MD. It was created on their behalf by Curry,Lauren E, a trained medical scribe. The creation of this record is based on the scribe's personal observations and the provider's statements to them.  HISTORY OF PRESENT ILLNESS:  The patient is a 41 y.o. female with mild leukocytosis.  She comes in today to reassess her leukocytosis.  Since her last visit, the patient has been doing fine.  She denies having any B symptoms which concern her for her leukocytosis being due to a hematologic malignancy.  PHYSICAL EXAM:  There were no vitals taken for this visit. Wt Readings from Last 3 Encounters:  04/07/21 273 lb 4.8 oz (124 kg)  02/09/21 275 lb (124.7 kg)  11/30/20 261 lb (118.4 kg)   There is no height or weight on file to calculate BMI. Performance status (ECOG): 0 Physical Exam Constitutional:      Appearance: Normal appearance. She is not ill-appearing.  HENT:     Mouth/Throat:     Mouth: Mucous membranes are moist.     Pharynx: Oropharynx is clear. No oropharyngeal exudate or posterior oropharyngeal erythema.  Cardiovascular:     Rate and Rhythm: Normal rate and regular rhythm.     Heart sounds: No murmur heard.   No friction rub. No gallop.  Pulmonary:     Effort: Pulmonary effort is normal. No respiratory distress.     Breath sounds: Normal breath sounds. No wheezing, rhonchi or rales.  Abdominal:     General: Bowel sounds are normal. There is no distension.     Palpations: Abdomen is soft. There is no mass.     Tenderness: There is no abdominal tenderness.  Musculoskeletal:        General: No swelling.     Right lower leg: No edema.     Left lower leg: No edema.  Lymphadenopathy:     Cervical: No cervical adenopathy.      Upper Body:     Right upper body: No supraclavicular or axillary adenopathy.     Left upper body: No supraclavicular or axillary adenopathy.     Lower Body: No right inguinal adenopathy. No left inguinal adenopathy.  Skin:    General: Skin is warm.     Coloration: Skin is not jaundiced.     Findings: No lesion or rash.  Neurological:     General: No focal deficit present.     Mental Status: She is alert and oriented to person, place, and time. Mental status is at baseline.  Psychiatric:        Mood and Affect: Mood normal.        Behavior: Behavior normal.        Thought Content: Thought content normal.    LABS:   CBC Latest Ref Rng & Units 04/07/2021 02/09/2021 10/06/2020  WBC - 13.0 11.9(H) 12.6  Hemoglobin 12.0 - 16.0 11.2(A) 12.0 12.5  Hematocrit 36 - 46 33(A) 36.3 38  Platelets 150 - 399 312 370 326    Ref. Range 04/07/2021 09:50  Iron Latest Ref Range: 28 - 170 ug/dL 51  UIBC Latest Units: ug/dL 287  TIBC Latest Ref Range: 250 - 450 ug/dL 338  Saturation Ratios Latest Ref Range: 10.4 - 31.8 % 15  Ferritin Latest Ref  Range: 11 - 307 ng/mL 26  Folate Latest Ref Range: >5.9 ng/mL 11.1  Vitamin B12 Latest Ref Range: 180 - 914 pg/mL 1,415 (H)    ASSESSMENT & PLAN:  Assessment/Plan:  A 41 y.o. female with mild leukocytosis.  Her white count remains only minimally elevated.  There is nothing per her history or physical exam which suggests some type of ominous process is present.  However, her hemoglobin is lower than what it has been in the past.  Labs do not show any nutritional deficiencies being present. Both her leukopenia and anemia will continue to be followed over time.  I will see her back in 4 months for repeat clinical assessment.  The patient understands all the plans discussed today and is in agreement with them.    I, Rita Ohara, am acting as scribe for Marice Potter, MD    I have reviewed this report as typed by the medical scribe, and it is complete and  accurate.  Dequincy Macarthur Critchley, MD

## 2021-08-14 ENCOUNTER — Ambulatory Visit: Payer: 59 | Admitting: Nurse Practitioner

## 2021-08-16 ENCOUNTER — Ambulatory Visit: Payer: 59 | Admitting: Oncology

## 2021-08-16 ENCOUNTER — Other Ambulatory Visit: Payer: 59

## 2021-08-22 ENCOUNTER — Other Ambulatory Visit: Payer: Self-pay | Admitting: Nurse Practitioner

## 2021-08-22 DIAGNOSIS — I10 Essential (primary) hypertension: Secondary | ICD-10-CM

## 2021-08-28 NOTE — Progress Notes (Signed)
[facility]  [address] [place],    [address] [phone]  Clinic Day:  09/01/2021  Referring physician: Rip Harbour, NP  This document serves as a record of services personally performed by Marice Potter, MD. It was created on their behalf by Curry,Lauren E, a trained medical scribe. The creation of this record is based on the scribe's personal observations and the provider's statements to them.  HISTORY OF PRESENT ILLNESS:  The patient is a 41 y.o. female with mild leukocytosis.  She comes in today to reassess her leukocytosis.  Since her last visit, the patient has been doing okay.  She has been more stressed due to death/health issues within her family.  However, she denies having recent infections which can trigger leukocytosis.  She continues to deny having any B symptoms which concern her for her leukocytosis being due to a hematologic malignancy.  PHYSICAL EXAM:  Blood pressure (!) 169/80, pulse 62, temperature 98.5 F (36.9 C), resp. rate 16, height 5\' 5"  (1.651 m), weight 279 lb 14.4 oz (127 kg), SpO2 99 %. Wt Readings from Last 3 Encounters:  09/01/21 279 lb 14.4 oz (127 kg)  04/07/21 273 lb 4.8 oz (124 kg)  02/09/21 275 lb (124.7 kg)   Body mass index is 46.58 kg/m. Performance status (ECOG): 0 Physical Exam Constitutional:      Appearance: Normal appearance. She is not ill-appearing.  HENT:     Mouth/Throat:     Mouth: Mucous membranes are moist.     Pharynx: Oropharynx is clear. No oropharyngeal exudate or posterior oropharyngeal erythema.  Cardiovascular:     Rate and Rhythm: Normal rate and regular rhythm.     Heart sounds: No murmur heard.   No friction rub. No gallop.  Pulmonary:     Effort: Pulmonary effort is normal. No respiratory distress.     Breath sounds: Normal breath sounds. No wheezing, rhonchi or rales.  Abdominal:     General: Bowel sounds are normal. There is no distension.     Palpations:  Abdomen is soft. There is no mass.     Tenderness: There is no abdominal tenderness.  Musculoskeletal:        General: No swelling.     Right lower leg: No edema.     Left lower leg: No edema.  Lymphadenopathy:     Cervical: No cervical adenopathy.     Upper Body:     Right upper body: No supraclavicular or axillary adenopathy.     Left upper body: No supraclavicular or axillary adenopathy.     Lower Body: No right inguinal adenopathy. No left inguinal adenopathy.  Skin:    General: Skin is warm.     Coloration: Skin is not jaundiced.     Findings: No lesion or rash.  Neurological:     General: No focal deficit present.     Mental Status: She is alert and oriented to person, place, and time. Mental status is at baseline.  Psychiatric:        Mood and Affect: Mood normal.        Behavior: Behavior normal.        Thought Content: Thought content normal.    LABS:   CBC Latest Ref Rng & Units 09/01/2021 04/07/2021 02/09/2021  WBC - 11.5 13.0 11.9(H)  Hemoglobin 12.0 - 16.0 11.8(A) 11.2(A) 12.0  Hematocrit 36 - 46 36 33(A) 36.3  Platelets 150 - 399 380 312 370   ASSESSMENT &  PLAN:  Assessment/Plan:  A 41 y.o. female with mild leukocytosis.  Her labs today show that her white count is only minimally elevated.  There remains nothing per her history or physical exam which suggests some type of ominous process is present.  I am also pleased that her hemoglobin is better than what it was previously.  From a hematologic standpoint, the patient appears to be doing well.  I will see her back in 6 months for repeat clinical assessment.  If her hematologic parameters are the same or better at that time, her care will be turned back over to her primary care office after her next visit.  The patient understands all the plans discussed today and is in agreement with them.    I, Rita Ohara, am acting as scribe for Marice Potter, MD    I have reviewed this report as typed by the medical scribe, and  it is complete and accurate.  Wania Longstreth Macarthur Critchley, MD

## 2021-09-01 ENCOUNTER — Telehealth: Payer: Self-pay | Admitting: Oncology

## 2021-09-01 ENCOUNTER — Inpatient Hospital Stay: Payer: Commercial Managed Care - HMO | Admitting: Oncology

## 2021-09-01 ENCOUNTER — Other Ambulatory Visit: Payer: Self-pay | Admitting: Oncology

## 2021-09-01 ENCOUNTER — Other Ambulatory Visit: Payer: Self-pay | Admitting: Hematology and Oncology

## 2021-09-01 ENCOUNTER — Other Ambulatory Visit: Payer: Self-pay

## 2021-09-01 ENCOUNTER — Inpatient Hospital Stay: Payer: Commercial Managed Care - HMO | Attending: Oncology

## 2021-09-01 VITALS — BP 169/80 | HR 62 | Temp 98.5°F | Resp 16 | Ht 65.0 in | Wt 279.9 lb

## 2021-09-01 DIAGNOSIS — D539 Nutritional anemia, unspecified: Secondary | ICD-10-CM

## 2021-09-01 DIAGNOSIS — D72829 Elevated white blood cell count, unspecified: Secondary | ICD-10-CM

## 2021-09-01 DIAGNOSIS — D509 Iron deficiency anemia, unspecified: Secondary | ICD-10-CM

## 2021-09-01 LAB — CBC AND DIFFERENTIAL
HCT: 36 (ref 36–46)
Hemoglobin: 11.8 — AB (ref 12.0–16.0)
Neutrophils Absolute: 7.59
Platelets: 380 (ref 150–399)
WBC: 11.5

## 2021-09-01 LAB — IRON AND TIBC
Iron: 39 ug/dL (ref 28–170)
Saturation Ratios: 9 % — ABNORMAL LOW (ref 10.4–31.8)
TIBC: 429 ug/dL (ref 250–450)
UIBC: 390 ug/dL

## 2021-09-01 LAB — CBC: RBC: 4.23 (ref 3.87–5.11)

## 2021-09-01 LAB — FERRITIN: Ferritin: 15 ng/mL (ref 11–307)

## 2021-09-01 NOTE — Telephone Encounter (Signed)
Per 09/01/21 los next appt scheduled and confirmed with patient

## 2021-11-20 ENCOUNTER — Other Ambulatory Visit: Payer: Self-pay | Admitting: Nurse Practitioner

## 2021-11-20 DIAGNOSIS — I10 Essential (primary) hypertension: Secondary | ICD-10-CM

## 2021-11-23 ENCOUNTER — Other Ambulatory Visit: Payer: Self-pay | Admitting: Nurse Practitioner

## 2021-11-23 DIAGNOSIS — I10 Essential (primary) hypertension: Secondary | ICD-10-CM

## 2021-11-29 ENCOUNTER — Other Ambulatory Visit: Payer: Self-pay | Admitting: Nurse Practitioner

## 2021-11-29 DIAGNOSIS — I10 Essential (primary) hypertension: Secondary | ICD-10-CM

## 2021-12-07 NOTE — Progress Notes (Signed)
? ?Subjective:  ?Patient ID: Rebekah Barnes, female    DOB: 08/09/1980  Age: 41 y.o. MRN: [id number] ? ?Chief Complaint  ?Patient presents with  ? HTN  ? ? ?HPI ? Rebekah Barnes is a 41 year old African-American female that presents for chronic follow-up of HTN, Vit D deficiency, and GERD. States she has experienced nausea for the past few days. Home pregnancy test negative yesterday. She tells me she has experienced the unexpected loss of her father and brother three months apart. States she is grieving appropriately.  ? ?Rebekah Barnes is concerned with her weight. Current weight 280 lbs, BMI 46.59. States she would like to lose weight to improve overall health.  ? ?Current Outpatient Medications on File Prior to Visit  ?Medication Sig Dispense Refill  ? benzonatate (TESSALON) 100 MG capsule TAKE 2 CAPSULES BY MOUTH THREE TIMES DAILY AS NEEDED FOR COUGH 30 capsule 0  ? citalopram (CELEXA) 10 MG tablet Take 1 tablet (10 mg total) by mouth daily. 90 tablet 0  ? clindamycin-benzoyl peroxide (BENZACLIN) gel Apply topically 2 (two) times daily. 25 g 0  ? fluticasone (FLONASE) 50 MCG/ACT nasal spray Place 2 sprays into both nostrils daily. 16 g 6  ? hydrochlorothiazide (HYDRODIURIL) 25 MG tablet Take 1 tablet by mouth once daily 90 tablet 0  ? meclizine (ANTIVERT) 25 MG tablet Take 1 tablet (25 mg total) by mouth 3 (three) times daily as needed for dizziness. 90 tablet 0  ? metoprolol tartrate (LOPRESSOR) 50 MG tablet Take 1 tablet (50 mg total) by mouth daily. 90 tablet 3  ? metoprolol-hydrochlorothiazide (LOPRESSOR HCT) 50-25 MG tablet Take 1 tablet by mouth once daily 30 tablet 0  ? Multiple Vitamin (MULTIVITAMIN) tablet Take 1 tablet by mouth daily.    ? promethazine-dextromethorphan (PROMETHAZINE-DM) 6.25-15 MG/5ML syrup Take 5 mLs by mouth 4 (four) times daily as needed for cough. 118 mL 0  ? Vitamin D, Ergocalciferol, (DRISDOL) 1.25 MG (50000 UNIT) CAPS capsule TAKE 1 CAPSULE BY MOUTH ONCE A WEEK (EVERY 7 DAYS) 12 capsule 3   ? ?No current facility-administered medications on file prior to visit.  ? ?Past Medical History:  ?Diagnosis Date  ? Essential (primary) hypertension   ? Fibroid   ? Morbid (severe) obesity due to excess calories ([place])   ? ?No past surgical history on file.  ?Family History  ?Problem Relation Age of Onset  ? Hypertension Father   ? Renal Disease Father   ? ?Social History  ? ?Socioeconomic History  ? Marital status: Single  ?  Spouse name: Not on file  ? Number of children: Not on file  ? Years of education: Not on file  ? Highest education level: Not on file  ?Occupational History  ? Not on file  ?Tobacco Use  ? Smoking status: Never  ? Smokeless tobacco: Never  ?Vaping Use  ? Vaping Use: Never used  ?Substance and Sexual Activity  ? Alcohol use: Yes  ?  Comment: Social  ? Drug use: Never  ? Sexual activity: Not Currently  ?  Comment: 1st intercourse 41 yo-Fewer than 5 partners  ?Other Topics Concern  ? Not on file  ?Social History Narrative  ? Not on file  ? ?Social Determinants of Health  ? ?Financial Resource Strain: Not on file  ?Food Insecurity: Not on file  ?Transportation Needs: Not on file  ?Physical Activity: Not on file  ?Stress: Not on file  ?Social Connections: Not on file  ? ? ?Review of Systems  ?Constitutional:  Negative for  chills, fatigue and fever.  ?HENT:  Negative for congestion, ear pain, rhinorrhea and sore throat.   ?Respiratory:  Negative for cough and shortness of breath.   ?Cardiovascular:  Negative for chest pain.  ?Gastrointestinal:  Negative for abdominal pain, constipation, diarrhea, nausea and vomiting.  ?Genitourinary:  Negative for dysuria and urgency.  ?Musculoskeletal:  Negative for back pain and myalgias.  ?Neurological:  Negative for dizziness, weakness, light-headedness and headaches.  ?Psychiatric/Behavioral:  Negative for dysphoric mood. The patient is not nervous/anxious.   ? ? ?Objective:  ?BP (!) 142/82   Pulse 100   Temp 97.8 ?F (36.6 ?C)   Ht '5\' 5"'$  (1.651 m)   Wt  280 lb (127 kg)   SpO2 97%   BMI 46.59 kg/m?   ? ? ?  09/01/2021  ?  9:55 AM 04/07/2021  ?  9:16 AM 02/09/2021  ?  7:56 AM  ?BP/Weight  ?Systolic BP 865 784 696  ?Diastolic BP 80 80 84  ?Wt. (Lbs) 279.9 273.3 275  ?BMI 46.58 kg/m2 45.48 kg/m2 45.76 kg/m2  ? ? ?Physical Exam ?Vitals reviewed.  ?Constitutional:   ?   Appearance: She is obese.  ?HENT:  ?   Head: Normocephalic.  ?   Right Ear: Tympanic membrane normal.  ?   Left Ear: Tympanic membrane normal.  ?   Nose: Nose normal.  ?   Mouth/Throat:  ?   Mouth: Mucous membranes are moist.  ?Eyes:  ?   Pupils: Pupils are equal, round, and reactive to light.  ?Cardiovascular:  ?   Rate and Rhythm: Normal rate and regular rhythm.  ?   Pulses: Normal pulses.  ?   Heart sounds: Normal heart sounds.  ?Pulmonary:  ?   Effort: Pulmonary effort is normal.  ?   Breath sounds: Normal breath sounds.  ?Abdominal:  ?   General: Bowel sounds are normal.  ?   Palpations: Abdomen is soft.  ?Musculoskeletal:     ?   General: Normal range of motion.  ?   Cervical back: Neck supple.  ?Skin: ?   General: Skin is warm and dry.  ?   Capillary Refill: Capillary refill takes less than 2 seconds.  ?Neurological:  ?   General: No focal deficit present.  ?   Mental Status: She is alert and oriented to person, place, and time.  ?Psychiatric:     ?   Mood and Affect: Mood normal.     ?   Behavior: Behavior normal.  ? ? ? ?  ? ?Lab Results  ?Component Value Date  ? WBC 11.5 09/01/2021  ? HGB 11.8 (A) 09/01/2021  ? HCT 36 09/01/2021  ? PLT 380 09/01/2021  ? GLUCOSE 88 02/09/2021  ? CHOL 139 03/31/2020  ? TRIG 73 03/31/2020  ? HDL 43 03/31/2020  ? [place] 81 03/31/2020  ? ALT 15 02/09/2021  ? AST 13 02/09/2021  ? NA 137 02/09/2021  ? K 4.3 02/09/2021  ? CL 101 02/09/2021  ? CREATININE 0.87 02/09/2021  ? BUN 9 02/09/2021  ? CO2 23 02/09/2021  ? TSH 1.220 03/31/2020  ? ? ? ? ?Assessment & Plan:  ? ? ?1. Essential hypertension-not at goal ?- CBC with Differential/Platelet ?- Comprehensive metabolic  panel ?- Lipid panel ?- TSH ?- amLODipine (NORVASC) 5 MG tablet; Take 1 tablet (5 mg total) by mouth daily.  Dispense: 90 tablet; Refill: 0 ?-continue HCTZ 25 mgand Metoprolol 50 mg ? ?2. Gastroesophageal reflux disease, unspecified whether esophagitis present ?-  omeprazole (PRILOSEC) 40 MG capsule; Take 1 capsule (40 mg total) by mouth daily.  Dispense: 90 capsule; Refill: 3 ?-avoid foods that trigger GERD ? ?3. Vitamin D deficiency ?- VITAMIN D 25 Hydroxy (Vit-D Deficiency, Fractures) ?-vit D rich diet ? ?4. Nausea ?- ondansetron (ZOFRAN-ODT) 4 MG disintegrating tablet; Take 1 tablet (4 mg total) by mouth every 8 (eight) hours as needed for nausea or vomiting.  Dispense: 20 tablet; Refill: 0 ? ?5. Class 3 severe obesity due to excess calories with serious comorbidity and body mass index (BMI) of 45.0 to 49.9 in adult [facility]) ?- Semaglutide-Weight Management (WEGOVY) 0.25 MG/0.5ML SOAJ; Inject 0.25 mg into the skin once a week.  Dispense: 2 mL; Refill: 0 ?- Semaglutide-Weight Management 0.5 MG/0.5ML SOAJ; Inject 0.5 mg into the skin once a week for 28 days.  Dispense: 2 mL; Refill: 0 ? ?6. Encounter for screening mammogram for malignant neoplasm of breast ?- MM DIGITAL SCREENING BILATERAL; Future ?  ? ? ?We will call you with lab results and mammogram appt ?Begin Omeprazole 40 mg daily for GERD ?Avoid foods that trigger GERD ?Begin Norvasc 5 mg for BP ?Monitor BP, keep log ?Return in 2 weeks for BP medication follow-up ?Begin Wegovy 0.25 mg injection weekly for 4 weeks, then increase to 0.5 mg injection for 4 weeks, then increase to 1 mg injection weekly for 4 weeks, then increase to 1.7 mg injection weekly for 4 weeks, then increase to 2 .4 mg injection weekly  ?Take Zofran 4 mg as needed for nausea  ? ?Follow-up: 2-weeks ? ?An After Visit Summary was printed and given to the patient. ? ?I, Rip Harbour, NP, have reviewed all documentation for this visit. The documentation on 12/09/21 for the exam, diagnosis,  procedures, and orders are all accurate and complete.  ? ? ?Signed, ?Rip Harbour, NP ?[place] ?[phone] ?

## 2021-12-08 ENCOUNTER — Other Ambulatory Visit: Payer: Self-pay | Admitting: Nurse Practitioner

## 2021-12-08 ENCOUNTER — Encounter: Payer: Self-pay | Admitting: Nurse Practitioner

## 2021-12-08 ENCOUNTER — Ambulatory Visit (INDEPENDENT_AMBULATORY_CARE_PROVIDER_SITE_OTHER): Payer: Commercial Managed Care - HMO | Admitting: Nurse Practitioner

## 2021-12-08 VITALS — BP 142/82 | HR 100 | Temp 97.8°F | Ht 65.0 in | Wt 280.0 lb

## 2021-12-08 DIAGNOSIS — I10 Essential (primary) hypertension: Secondary | ICD-10-CM

## 2021-12-08 DIAGNOSIS — E559 Vitamin D deficiency, unspecified: Secondary | ICD-10-CM

## 2021-12-08 DIAGNOSIS — R110 Nausea: Secondary | ICD-10-CM | POA: Diagnosis not present

## 2021-12-08 DIAGNOSIS — Z6841 Body Mass Index (BMI) 40.0 and over, adult: Secondary | ICD-10-CM

## 2021-12-08 DIAGNOSIS — K219 Gastro-esophageal reflux disease without esophagitis: Secondary | ICD-10-CM

## 2021-12-08 DIAGNOSIS — Z1231 Encounter for screening mammogram for malignant neoplasm of breast: Secondary | ICD-10-CM

## 2021-12-08 MED ORDER — SEMAGLUTIDE-WEIGHT MANAGEMENT 0.5 MG/0.5ML ~~LOC~~ SOAJ: 0.5000 mg | SUBCUTANEOUS | 0 refills | Status: DC

## 2021-12-08 MED ORDER — AMLODIPINE BESYLATE 5 MG PO TABS: 5.0000 mg | ORAL_TABLET | Freq: Every day | ORAL | 0 refills | Status: DC

## 2021-12-08 MED ORDER — WEGOVY 0.25 MG/0.5ML ~~LOC~~ SOAJ: 0.2500 mg | SUBCUTANEOUS | 0 refills | Status: DC

## 2021-12-08 MED ORDER — ONDANSETRON 4 MG PO TBDP: 4.0000 mg | ORAL_TABLET | Freq: Three times a day (TID) | ORAL | 0 refills | Status: DC | PRN

## 2021-12-08 MED ORDER — OMEPRAZOLE 40 MG PO CPDR: 40.0000 mg | DELAYED_RELEASE_CAPSULE | Freq: Every day | ORAL | 3 refills | Status: DC

## 2021-12-08 NOTE — Patient Instructions (Addendum)
We will call you with lab results and mammogram appt ?Begin Omeprazole 40 mg daily for GERD ?Avoid foods that trigger GERD ?Begin Norvasc 5 mg for BP ?Monitor BP, keep log ?Return in 2 weeks for BP medication follow-up ?Begin Wegovy 0.25 mg injection weekly for 4 weeks, then increase to 0.5 mg injection for 4 weeks, then increase to 1 mg injection weekly for 4 weeks, then increase to 1.7 mg injection weekly for 4 weeks, then increase to 2 .4 mg injection weekly  ?Take Zofran 4 mg as needed for nausea  ? ?Semaglutide Injection (Weight Management) ?What is this medication? ?SEMAGLUTIDE (SEM a GLOO tide) promotes weight loss. It may also be used to maintain weight loss. It works by decreasing appetite. Changes to diet and exercise are often combined with this medication. ?This medicine may be used for other purposes; ask your health care provider or pharmacist if you have questions. ?COMMON BRAND NAME(S): Wegovy ?What should I tell my care team before I take this medication? ?They need to know if you have any of these conditions: ?Endocrine tumors (MEN 2) or if someone in your family had these tumors ?Eye disease, vision problems ?Gallbladder disease ?History of depression or mental health disease ?History of pancreatitis ?Kidney disease ?Stomach or intestine problems ?Suicidal thoughts, plans, or attempt; a previous suicide attempt by you or a family member ?Thyroid cancer or if someone in your family had thyroid cancer ?An unusual or allergic reaction to semaglutide, other medications, foods, dyes, or preservatives ?Pregnant or trying to get pregnant ?Breast-feeding ?How should I use this medication? ?This medication is injected under the skin. You will be taught how to prepare and give it. Take it as directed on the prescription label. It is given once every week (every 7 days). Keep taking it unless your care team tells you to stop. ?It is important that you put your used needles and pens in a special sharps  container. Do not put them in a trash can. If you do not have a sharps container, call your pharmacist or care team to get one. ?A special MedGuide will be given to you by the pharmacist with each prescription and refill. Be sure to read this information carefully each time. ?This medication comes with INSTRUCTIONS FOR USE. Ask your pharmacist for directions on how to use this medication. Read the information carefully. Talk to your pharmacist or care team if you have questions. ?Talk to your care team about the use of this medication in children. While it may be prescribed for children as young as 12 years for selected conditions, precautions do apply. ?Overdosage: If you think you have taken too much of this medicine contact a poison control center or emergency room at once. ?NOTE: This medicine is only for you. Do not share this medicine with others. ?What if I miss a dose? ?If you miss a dose and the next scheduled dose is more than 2 days away, take the missed dose as soon as possible. If you miss a dose and the next scheduled dose is less than 2 days away, do not take the missed dose. Take the next dose at your regular time. Do not take double or extra doses. If you miss your dose for 2 weeks or more, take the next dose at your regular time or call your care team to talk about how to restart this medication. ?What may interact with this medication? ?Insulin and other medications for diabetes ?This list may not describe all possible  interactions. Give your health care provider a list of all the medicines, herbs, non-prescription drugs, or dietary supplements you use. Also tell them if you smoke, drink alcohol, or use illegal drugs. Some items may interact with your medicine. ?What should I watch for while using this medication? ?Visit your care team for regular checks on your progress. It may be some time before you see the benefit from this medication. ?Drink plenty of fluids while taking this medication.  Check with your care team if you have severe diarrhea, nausea, and vomiting, or if you sweat a lot. The loss of too much body fluid may make it dangerous for you to take this medication. ?This medication may affect blood sugar levels. Ask your care team if changes in diet or medications are needed if you have diabetes. ?If you or your family notice any changes in your behavior, such as new or worsening depression, thoughts of harming yourself, anxiety, other unusual or disturbing thoughts, or memory loss, call your care team right away. ?Women should inform their care team if they wish to become pregnant or think they might be pregnant. Losing weight while pregnant is not advised and may cause harm to the unborn child. Talk to your care team for more information. ?What side effects may I notice from receiving this medication? ?Side effects that you should report to your care team as soon as possible: ?Allergic reactions--skin rash, itching, hives, swelling of the face, lips, tongue, or throat ?Change in vision ?Dehydration--increased thirst, dry mouth, feeling faint or lightheaded, headache, dark yellow or brown urine ?Gallbladder problems--severe stomach pain, nausea, vomiting, fever ?Heart palpitations--rapid, pounding, or irregular heartbeat ?Kidney injury--decrease in the amount of urine, swelling of the ankles, hands, or feet ?Pancreatitis--severe stomach pain that spreads to your back or gets worse after eating or when touched, fever, nausea, vomiting ?Thoughts of suicide or self-harm, worsening mood, feelings of depression ?Thyroid cancer--new mass or lump in the neck, pain or trouble swallowing, trouble breathing, hoarseness ?Side effects that usually do not require medical attention (report to your care team if they continue or are bothersome): ?Diarrhea ?Loss of appetite ?Nausea ?Stomach pain ?Vomiting ?This list may not describe all possible side effects. Call your doctor for medical advice about side  effects. You may report side effects to FDA at 1-800-FDA-1088. ?Where should I keep my medication? ?Keep out of the reach of children and pets. ?Refrigeration (preferred): Store in the refrigerator. Do not freeze. Keep this medication in the original container until you are ready to take it. Get rid of any unused medication after the expiration date. ?Room temperature: If needed, prior to cap removal, the pen can be stored at room temperature for up to 28 days. Protect from light. If it is stored at room temperature, get rid of any unused medication after 28 days or after it expires, whichever is first. ?It is important to get rid of the medication as soon as you no longer need it or it is expired. You can do this in two ways: ?Take the medication to a medication take-back program. Check with your pharmacy or law enforcement to find a location. ?If you cannot return the medication, follow the directions in the [place]. ?NOTE: This sheet is a summary. It may not cover all possible information. If you have questions about this medicine, talk to your doctor, pharmacist, or health care provider. ?? 2023 Elsevier/Gold Standard (2021-08-09 00:00:00) ? ? ?Food Choices for Gastroesophageal Reflux Disease, Adult ?When you have gastroesophageal reflux  disease (GERD), the foods you eat and your eating habits are very important. Choosing the right foods can help ease the discomfort of GERD. Consider working with a dietitian to help you make healthy food choices. ?What are tips for following this plan? ?Reading food labels ?Look for foods that are low in saturated fat. Foods that have less than 5% of daily value (DV) of fat and 0 g of trans fats may help with your symptoms. ?Cooking ?Cook foods using methods other than frying. This may include baking, steaming, grilling, or broiling. These are all methods that do not need a lot of fat for cooking. ?To add flavor, try to use herbs that are low in spice and acidity. ?Meal  planning ? ?Choose healthy foods that are low in fat, such as fruits, vegetables, whole grains, low-fat dairy products, lean meats, fish, and poultry. ?Eat frequent, small meals instead of three large meals each day. Eat

## 2021-12-09 LAB — COMPREHENSIVE METABOLIC PANEL
ALT: 14 IU/L (ref 0–32)
AST: 17 IU/L (ref 0–40)
Albumin/Globulin Ratio: 1.3 (ref 1.2–2.2)
Albumin: 4.1 g/dL (ref 3.8–4.8)
Alkaline Phosphatase: 90 IU/L (ref 44–121)
BUN/Creatinine Ratio: 13 (ref 9–23)
BUN: 13 mg/dL (ref 6–20)
Bilirubin Total: 0.4 mg/dL (ref 0.0–1.2)
CO2: 24 mmol/L (ref 20–29)
Calcium: 9.4 mg/dL (ref 8.7–10.2)
Chloride: 102 mmol/L (ref 96–106)
Creatinine, Ser: 0.98 mg/dL (ref 0.57–1.00)
Globulin, Total: 3.1 g/dL (ref 1.5–4.5)
Glucose: 91 mg/dL (ref 70–99)
Potassium: 4.5 mmol/L (ref 3.5–5.2)
Sodium: 138 mmol/L (ref 134–144)
Total Protein: 7.2 g/dL (ref 6.0–8.5)
eGFR: 75 mL/min/{1.73_m2} (ref >59)

## 2021-12-09 LAB — CBC WITH DIFFERENTIAL/PLATELET
Basophils Absolute: 0.1 10*3/uL (ref 0.0–0.2)
Basos: 1 %
EOS (ABSOLUTE): 0.3 10*3/uL (ref 0.0–0.4)
Eos: 3 %
Hematocrit: 35.8 % (ref 34.0–46.6)
Hemoglobin: 11.7 g/dL (ref 11.1–15.9)
Immature Grans (Abs): 0.1 10*3/uL (ref 0.0–0.1)
Immature Granulocytes: 1 %
Lymphocytes Absolute: 2.5 10*3/uL (ref 0.7–3.1)
Lymphs: 19 %
MCH: 27.6 pg (ref 26.6–33.0)
MCHC: 32.7 g/dL (ref 31.5–35.7)
MCV: 84 fL (ref 79–97)
Monocytes Absolute: 1.2 10*3/uL — ABNORMAL HIGH (ref 0.1–0.9)
Monocytes: 9 %
Neutrophils Absolute: 9 10*3/uL — ABNORMAL HIGH (ref 1.4–7.0)
Neutrophils: 67 %
Platelets: 408 10*3/uL (ref 150–450)
RBC: 4.24 x10E6/uL (ref 3.77–5.28)
RDW: 13.3 % (ref 11.7–15.4)
WBC: 13.2 10*3/uL — ABNORMAL HIGH (ref 3.4–10.8)

## 2021-12-09 LAB — TSH: TSH: 2.42 u[IU]/mL (ref 0.450–4.500)

## 2021-12-09 LAB — VITAMIN D 25 HYDROXY (VIT D DEFICIENCY, FRACTURES): Vit D, 25-Hydroxy: 33.6 ng/mL (ref 30.0–100.0)

## 2021-12-09 LAB — LIPID PANEL
Chol/HDL Ratio: 3.8 ratio (ref 0.0–4.4)
Cholesterol, Total: 164 mg/dL (ref 100–199)
HDL: 43 mg/dL (ref >39)
LDL Chol Calc (NIH): 101 mg/dL — ABNORMAL HIGH (ref 0–99)
Triglycerides: 110 mg/dL (ref 0–149)
VLDL Cholesterol Cal: 20 mg/dL (ref 5–40)

## 2021-12-09 LAB — CARDIOVASCULAR RISK ASSESSMENT

## 2021-12-14 ENCOUNTER — Ambulatory Visit: Payer: Commercial Managed Care - HMO

## 2021-12-14 NOTE — Progress Notes (Signed)
Patient aware to continue current medications. Keep appts a scheduled. ?

## 2021-12-21 NOTE — Progress Notes (Signed)
Subjective:  Patient ID: Rebekah Barnes, female    DOB: 1981/01/01  Age: 41 y.o. MRN: [id number]  Chief Complaint  Patient presents with   Hypertension    4 wk f/u    HPI: Pt returns for f/u uncontrolled HTN.  Hypertension Pertinent negatives include no chest pain, headaches or shortness of breath.    She was last seen for hypertension 2 weeks ago.  BP at that visit was 142/82. Management since that visit includes added Norvasc 5 mg continue HCTZ and Metoprolol.  She reports excellent compliance with treatment. She is not having side effects.  She is following a Regular diet. She is not exercising. She does not smoke.  Use of agents associated with hypertension: none.   Outside blood pressures are 137/70, 142/85, 138/83, Pertinent labs: Lab Results  Component Value Date   CHOL 164 12/08/2021   HDL 43 12/08/2021   LDLCALC 101 (H) 12/08/2021   TRIG 110 12/08/2021   CHOLHDL 3.8 12/08/2021   Lab Results  Component Value Date   NA 138 12/08/2021   K 4.5 12/08/2021   CREATININE 0.98 12/08/2021   EGFR 75 12/08/2021   GFRNONAA 90 07/14/2020   GLUCOSE 91 12/08/2021     The ASCVD Risk score (Arnett DK, et al., 2019) failed to calculate for the following reasons:   The 2019 ASCVD risk score is only valid for ages 74 to 46   Current Outpatient Medications on File Prior to Visit  Medication Sig Dispense Refill   amLODipine (NORVASC) 5 MG tablet Take 1 tablet (5 mg total) by mouth daily. 90 tablet 0   benzonatate (TESSALON) 100 MG capsule TAKE 2 CAPSULES BY MOUTH THREE TIMES DAILY AS NEEDED FOR COUGH 30 capsule 0   citalopram (CELEXA) 10 MG tablet Take 1 tablet (10 mg total) by mouth daily. 90 tablet 0   clindamycin-benzoyl peroxide (BENZACLIN) gel Apply topically 2 (two) times daily. 25 g 0   fluticasone (FLONASE) 50 MCG/ACT nasal spray Place 2 sprays into both nostrils daily. 16 g 6   hydrochlorothiazide (HYDRODIURIL) 25 MG tablet Take 1 tablet by mouth once daily 90  tablet 0   metoprolol tartrate (LOPRESSOR) 50 MG tablet Take 1 tablet (50 mg total) by mouth daily. 90 tablet 3   Multiple Vitamin (MULTIVITAMIN) tablet Take 1 tablet by mouth daily.     omeprazole (PRILOSEC) 40 MG capsule Take 1 capsule (40 mg total) by mouth daily. 90 capsule 3   ondansetron (ZOFRAN-ODT) 4 MG disintegrating tablet Take 1 tablet (4 mg total) by mouth every 8 (eight) hours as needed for nausea or vomiting. 20 tablet 0   promethazine-dextromethorphan (PROMETHAZINE-DM) 6.25-15 MG/5ML syrup Take 5 mLs by mouth 4 (four) times daily as needed for cough. 118 mL 0   Semaglutide-Weight Management (WEGOVY) 0.25 MG/0.5ML SOAJ Inject 0.25 mg into the skin once a week. 2 mL 0   [START ON 01/06/2022] Semaglutide-Weight Management 0.5 MG/0.5ML SOAJ Inject 0.5 mg into the skin once a week for 28 days. 2 mL 0   Vitamin D, Ergocalciferol, (DRISDOL) 1.25 MG (50000 UNIT) CAPS capsule TAKE 1 CAPSULE BY MOUTH ONCE A WEEK (EVERY 7 DAYS) 12 capsule 3   No current facility-administered medications on file prior to visit.   Past Medical History:  Diagnosis Date   Essential (primary) hypertension    Fibroid    Morbid (severe) obesity due to excess calories ([place])    No past surgical history on file.  Family History  Problem Relation Age of  Onset   Hypertension Father    Renal Disease Father    Social History   Socioeconomic History   Marital status: Single    Spouse name: Not on file   Number of children: Not on file   Years of education: Not on file   Highest education level: Not on file  Occupational History   Not on file  Tobacco Use   Smoking status: Never   Smokeless tobacco: Never  Vaping Use   Vaping Use: Never used  Substance and Sexual Activity   Alcohol use: Yes    Comment: Social   Drug use: Never   Sexual activity: Not Currently    Comment: 1st intercourse 41 yo-Fewer than 5 partners  Other Topics Concern   Not on file  Social History Narrative   Not on file   Social  Determinants of Health   Financial Resource Strain: Not on file  Food Insecurity: Not on file  Transportation Needs: Not on file  Physical Activity: Not on file  Stress: Not on file  Social Connections: Not on file    Review of Systems  Constitutional:  Negative for chills, fatigue and fever.  Respiratory:  Negative for cough and shortness of breath.   Cardiovascular:  Negative for chest pain.  Neurological:  Negative for dizziness and headaches.    Objective:  BP 124/82   Pulse 86   Temp 97.6 F (36.4 C)   Ht 5' 5" (1.651 m)   Wt 276 lb (125.2 kg)   SpO2 98%   BMI 45.93 kg/m       12/14/2021    8:50 AM 12/08/2021    7:35 AM 09/01/2021    9:55 AM  BP/Weight  Systolic BP 638 177 116  Diastolic BP 82 82 80  Wt. (Lbs)  280 279.9  BMI  46.59 kg/m2 46.58 kg/m2    Physical Exam Vitals reviewed.  Constitutional:      Appearance: She is obese.  Cardiovascular:     Rate and Rhythm: Normal rate and regular rhythm.     Pulses: Normal pulses.     Heart sounds: Normal heart sounds.  Pulmonary:     Effort: Pulmonary effort is normal.     Breath sounds: Normal breath sounds.  Musculoskeletal:        General: No swelling. Normal range of motion.  Skin:    General: Skin is warm and dry.  Neurological:     Mental Status: She is alert.        Lab Results  Component Value Date   WBC 13.2 (H) 12/08/2021   HGB 11.7 12/08/2021   HCT 35.8 12/08/2021   PLT 408 12/08/2021   GLUCOSE 91 12/08/2021   CHOL 164 12/08/2021   TRIG 110 12/08/2021   HDL 43 12/08/2021   LDLCALC 101 (H) 12/08/2021   ALT 14 12/08/2021   AST 17 12/08/2021   NA 138 12/08/2021   K 4.5 12/08/2021   CL 102 12/08/2021   CREATININE 0.98 12/08/2021   BUN 13 12/08/2021   CO2 24 12/08/2021   TSH 2.420 12/08/2021      Assessment & Plan:   1. Essential hypertension-well controlled -continue Norvasc 5 mg, Metoprolol, and HCTZ -DASH diet -increase physical activity    Continue  medications Follow-up fasting in 74-month     Follow-up: 362-month fasting  An After Visit Summary was printed and given to the patient.  I, ShRip HarbourNP, have reviewed all documentation for this visit. The documentation  on 12/22/21 for the exam, diagnosis, procedures, and orders are all accurate and complete.   Signed, Rip Harbour, NP [place] [phone]

## 2021-12-22 ENCOUNTER — Ambulatory Visit (INDEPENDENT_AMBULATORY_CARE_PROVIDER_SITE_OTHER): Payer: Commercial Managed Care - HMO | Admitting: Nurse Practitioner

## 2021-12-22 ENCOUNTER — Encounter: Payer: Self-pay | Admitting: Nurse Practitioner

## 2021-12-22 VITALS — BP 124/82 | HR 86 | Temp 97.6°F | Ht 65.0 in | Wt 276.0 lb

## 2021-12-22 DIAGNOSIS — I10 Essential (primary) hypertension: Secondary | ICD-10-CM | POA: Diagnosis not present

## 2021-12-22 NOTE — Patient Instructions (Signed)
Continue medications Follow-up fasting in 22-month   Managing Your Hypertension Hypertension, also called high blood pressure, is when the force of the blood pressing against the walls of the arteries is too strong. Arteries are blood vessels that carry blood from your heart throughout your body. Hypertension forces the heart to work harder to pump blood and may cause the arteries to become narrow or stiff. Understanding blood pressure readings A blood pressure reading includes a higher number over a lower number: The first, or top, number is called the systolic pressure. It is a measure of the pressure in your arteries as your heart beats. The second, or bottom number, is called the diastolic pressure. It is a measure of the pressure in your arteries as the heart relaxes. For most people, a normal blood pressure is below 120/80. Your personal target blood pressure may vary depending on your medical conditions, your age, and other factors. Blood pressure is classified into four stages. Based on your blood pressure reading, your health care provider may use the following stages to determine what type of treatment you need, if any. Systolic pressure and diastolic pressure are measured in a unit called millimeters of mercury (mmHg). Normal Systolic pressure: below 1449 Diastolic pressure: below 80. Elevated Systolic pressure: 1675-916 Diastolic pressure: below 80. Hypertension stage 1 Systolic pressure: 1384-665 Diastolic pressure: 899-35 Hypertension stage 2 Systolic pressure: 1701or above. Diastolic pressure: 90 or above. How can this condition affect me? Managing your hypertension is very important. Over time, hypertension can damage the arteries and decrease blood flow to parts of the body, including the brain, heart, and kidneys. Having untreated or uncontrolled hypertension can lead to: A heart attack. A stroke. A weakened blood vessel (aneurysm). Heart failure. Kidney damage. Eye  damage. Memory and concentration problems. Vascular dementia. What actions can I take to manage this condition? Hypertension can be managed by making lifestyle changes and possibly by taking medicines. Your health care provider will help you make a plan to bring your blood pressure within a normal range. You may be referred for counseling on a healthy diet and physical activity. Nutrition  Eat a diet that is high in fiber and potassium, and low in salt (sodium), added sugar, and fat. An example eating plan is called the DASH diet. DASH stands for Dietary Approaches to Stop Hypertension. To eat this way: Eat plenty of fresh fruits and vegetables. Try to fill one-half of your plate at each meal with fruits and vegetables. Eat whole grains, such as whole-wheat pasta, brown rice, or whole-grain bread. Fill about one-fourth of your plate with whole grains. Eat low-fat dairy products. Avoid fatty cuts of meat, processed or cured meats, and poultry with skin. Fill about one-fourth of your plate with lean proteins such as fish, chicken without skin, beans, eggs, and tofu. Avoid pre-made and processed foods. These tend to be higher in sodium, added sugar, and fat. Reduce your daily sodium intake. Many people with hypertension should eat less than 1,500 mg of sodium a day. Lifestyle  Work with your health care provider to maintain a healthy body weight or to lose weight. Ask what an ideal weight is for you. Get at least 30 minutes of exercise that causes your heart to beat faster (aerobic exercise) most days of the week. Activities may include walking, swimming, or biking. Include exercise to strengthen your muscles (resistance exercise), such as weight lifting, as part of your weekly exercise routine. Try to do these types of exercises for 30  minutes at least 3 days a week. Do not use any products that contain nicotine or tobacco. These products include cigarettes, chewing tobacco, and vaping devices, such  as e-cigarettes. If you need help quitting, ask your health care provider. Control any long-term (chronic) conditions you have, such as high cholesterol or diabetes. Identify your sources of stress and find ways to manage stress. This may include meditation, deep breathing, or making time for fun activities. Alcohol use Do not drink alcohol if: Your health care provider tells you not to drink. You are pregnant, may be pregnant, or are planning to become pregnant. If you drink alcohol: Limit how much you have to: 0-1 drink a day for women. 0-2 drinks a day for men. Know how much alcohol is in your drink. In the [place] one drink equals one 12 oz bottle of beer (355 mL), one 5 oz glass of wine (148 mL), or one 1 oz glass of hard liquor (44 mL). Medicines Your health care provider may prescribe medicine if lifestyle changes are not enough to get your blood pressure under control and if: Your systolic blood pressure is 130 or higher. Your diastolic blood pressure is 80 or higher. Take medicines only as told by your health care provider. Follow the directions carefully. Blood pressure medicines must be taken as told by your health care provider. The medicine does not work as well when you skip doses. Skipping doses also puts you at risk for problems. Monitoring Before you monitor your blood pressure: Do not smoke, drink caffeinated beverages, or exercise within 30 minutes before taking a measurement. Use the bathroom and empty your bladder (urinate). Sit quietly for at least 5 minutes before taking measurements. Monitor your blood pressure at home as told by your health care provider. To do this: Sit with your back straight and supported. Place your feet flat on the floor. Do not cross your legs. Support your arm on a flat surface, such as a table. Make sure your upper arm is at heart level. Each time you measure, take two or three readings one minute apart and record the results. You may also  need to have your blood pressure checked regularly by your health care provider. General information Talk with your health care provider about your diet, exercise habits, and other lifestyle factors that may be contributing to hypertension. Review all the medicines you take with your health care provider because there may be side effects or interactions. Keep all follow-up visits. Your health care provider can help you create and adjust your plan for managing your high blood pressure. Where to find more information National Heart, Lung, and Blood Institute: https://wilson-eaton.com/ American Heart Association: www.heart.org Contact a health care provider if: You think you are having a reaction to medicines you have taken. You have repeated (recurrent) headaches. You feel dizzy. You have swelling in your ankles. You have trouble with your vision. Get help right away if: You develop a severe headache or confusion. You have unusual weakness or numbness, or you feel faint. You have severe pain in your chest or abdomen. You vomit repeatedly. You have trouble breathing. These symptoms may be an emergency. Get help right away. Call 911. Do not wait to see if the symptoms will go away. Do not drive yourself to the hospital. Summary Hypertension is when the force of blood pumping through your arteries is too strong. If this condition is not controlled, it may put you at risk for serious complications. Your personal target blood  pressure may vary depending on your medical conditions, your age, and other factors. For most people, a normal blood pressure is less than 120/80. Hypertension is managed by lifestyle changes, medicines, or both. Lifestyle changes to help manage hypertension include losing weight, eating a healthy, low-sodium diet, exercising more, stopping smoking, and limiting alcohol. This information is not intended to replace advice given to you by your health care provider. Make sure you  discuss any questions you have with your health care provider. Document Revised: 04/06/2021 Document Reviewed: 04/06/2021 Elsevier Patient Education  [place].

## 2021-12-27 ENCOUNTER — Other Ambulatory Visit: Payer: Self-pay | Admitting: Nurse Practitioner

## 2021-12-27 ENCOUNTER — Ambulatory Visit
Admission: RE | Admit: 2021-12-27 | Discharge: 2021-12-27 | Disposition: A | Discharge status: Home / Self Care | Payer: Commercial Managed Care - HMO | Source: Ambulatory Visit | Attending: Nurse Practitioner | Admitting: Nurse Practitioner

## 2021-12-27 DIAGNOSIS — Z1231 Encounter for screening mammogram for malignant neoplasm of breast: Secondary | ICD-10-CM

## 2022-01-02 ENCOUNTER — Other Ambulatory Visit: Payer: Self-pay | Admitting: Nurse Practitioner

## 2022-01-02 DIAGNOSIS — R928 Other abnormal and inconclusive findings on diagnostic imaging of breast: Secondary | ICD-10-CM

## 2022-01-12 ENCOUNTER — Ambulatory Visit
Admission: RE | Admit: 2022-01-12 | Discharge: 2022-01-12 | Disposition: A | Discharge status: Home / Self Care | Payer: Commercial Managed Care - HMO | Source: Ambulatory Visit | Attending: Nurse Practitioner | Admitting: Nurse Practitioner

## 2022-01-12 ENCOUNTER — Ambulatory Visit: Payer: Commercial Managed Care - HMO

## 2022-01-12 DIAGNOSIS — R928 Other abnormal and inconclusive findings on diagnostic imaging of breast: Secondary | ICD-10-CM

## 2022-01-31 ENCOUNTER — Encounter: Payer: Self-pay | Admitting: Nurse Practitioner

## 2022-01-31 ENCOUNTER — Ambulatory Visit (INDEPENDENT_AMBULATORY_CARE_PROVIDER_SITE_OTHER): Payer: Commercial Managed Care - HMO | Admitting: Nurse Practitioner

## 2022-01-31 VITALS — BP 134/78 | HR 84 | Temp 98.4°F | Wt 278.0 lb

## 2022-01-31 DIAGNOSIS — J029 Acute pharyngitis, unspecified: Secondary | ICD-10-CM

## 2022-01-31 MED ORDER — AZITHROMYCIN 250 MG PO TABS: ORAL_TABLET | ORAL | 0 refills | Status: AC

## 2022-01-31 NOTE — Patient Instructions (Addendum)
Warm salt water gargles as needed Rest and push fluids Continue Tessalon Perles and Mucinex as needed for cough Take Z-pack as directed Replace toothbrush tomorrow Follow-up as needed    Pharyngitis  Pharyngitis is a sore throat (pharynx). This is when there is redness, pain, and swelling in your throat. Most of the time, this condition gets better on its own. In some cases, you may need medicine. What are the causes? An infection from a virus. An infection from bacteria. Allergies. What increases the risk? Being 82-9 years old. Being in crowded environments. These include: Daycares. Schools. Dormitories. Living in a place with cold temperatures outside. Having a weakened disease-fighting (immune) system. What are the signs or symptoms? Symptoms may vary depending on the cause. Common symptoms include: Sore throat. Tiredness (fatigue). Low-grade fever. Stuffy nose. Cough. Headache. Other symptoms may include: Glands in the neck (lymph nodes) that are swollen. Skin rashes. Film on the throat or tonsils. This can be caused by an infection from bacteria. Vomiting. Red, itchy eyes. Loss of appetite. Joint pain and muscle aches. Tonsils that are temporarily bigger than usual (enlarged). How is this treated? Many times, treatment is not needed. This condition usually gets better in 3-4 days without treatment. If the infection is caused by a bacteria, you may be need to take antibiotics. Follow these instructions at home: Medicines Take over-the-counter and prescription medicines only as told by your doctor. If you were prescribed an antibiotic medicine, take it as told by your doctor. Do not stop taking the antibiotic even if you start to feel better. Use throat lozenges or sprays to soothe your throat as told by your doctor. Children can get pharyngitis. Do not give your child aspirin. Managing pain To help with pain, try: Sipping warm liquids, such as: Broth. Herbal  tea. Warm water. Eating or drinking cold or frozen liquids, such as frozen ice pops. Rinsing your mouth (gargle) with a salt water mixture 3-4 times a day or as needed. To make salt water, dissolve -1 tsp (3-6 g) of salt in 1 cup (237 mL) of warm water. Do not swallow this mixture. Sucking on hard candy or throat lozenges. Putting a cool-mist humidifier in your bedroom at night to moisten the air. Sitting in the bathroom with the door closed for 5-10 minutes while you run hot water in the shower.  General instructions  Do not smoke or use any products that contain nicotine or tobacco. If you need help quitting, ask your doctor. Rest as told by your doctor. Drink enough fluid to keep your pee (urine) pale yellow. How is this prevented? Wash your hands often for at least 20 seconds with soap and water. If soap and water are not available, use hand sanitizer. Do not touch your eyes, nose, or mouth with unwashed hands. Wash hands after touching these areas. Do not share cups or eating utensils. Avoid close contact with people who are sick. Contact a doctor if: You have large, tender lumps in your neck. You have a rash. You cough up green, yellow-brown, or bloody spit. Get help right away if: You have a stiff neck. You drool or cannot swallow liquids. You cannot drink or take medicines without vomiting. You have very bad pain that does not go away with medicine. You have problems breathing, and it is not from a stuffy nose. You have new pain and swelling in your knees, ankles, wrists, or elbows. These symptoms may be an emergency. Get help right away. Call your local  emergency services (911 in the [place] Do not wait to see if the symptoms will go away. Do not drive yourself to the hospital. Summary Pharyngitis is a sore throat (pharynx). This is when there is redness, pain, and swelling in your throat. Most of the time, pharyngitis gets better on its own. Sometimes, you may need  medicine. If you were prescribed an antibiotic medicine, take it as told by your doctor. Do not stop taking the antibiotic even if you start to feel better. This information is not intended to replace advice given to you by your health care provider. Make sure you discuss any questions you have with your health care provider. Document Revised: 10/19/2020 Document Reviewed: 10/19/2020 Elsevier Patient Education  [place].

## 2022-01-31 NOTE — Progress Notes (Signed)
Acute Office Visit  Subjective:    Patient ID: Rebekah Barnes, female    DOB: Dec 15, 1980, 41 y.o.   MRN: [id number]  CC: Sore throat  HPI: Patient is in today for URI symptoms  Upper respiratory symptoms She complains of congestion, cough described as productive, headache, nasal congestion, and sore throat. Denies fever, chills, or night sweats. Onset of symptoms was yesterday and worsening. Treatment is drinking plenty of fluids, warm salt water gargles, Flonase, Mucinex,.  Past history is significant for  allergic rhinitis . Patient is non-smoker.   Past Medical History:  Diagnosis Date   Essential (primary) hypertension    Fibroid    Morbid (severe) obesity due to excess calories ([place])     No past surgical history on file.  Family History  Problem Relation Age of Onset   Hypertension Father    Renal Disease Father    Breast cancer Neg Hx     Social History   Socioeconomic History   Marital status: Single    Spouse name: Not on file   Number of children: Not on file   Years of education: Not on file   Highest education level: Not on file  Occupational History   Not on file  Tobacco Use   Smoking status: Never   Smokeless tobacco: Never  Vaping Use   Vaping Use: Never used  Substance and Sexual Activity   Alcohol use: Yes    Comment: Social   Drug use: Never   Sexual activity: Not Currently    Comment: 1st intercourse 41 yo-Fewer than 5 partners  Other Topics Concern   Not on file  Social History Narrative   Not on file   Social Determinants of Health   Financial Resource Strain: Low Risk  (12/22/2021)   Overall Financial Resource Strain (CARDIA)    Difficulty of Paying Living Expenses: Not hard at all  Food Insecurity: No Food Insecurity (12/22/2021)   Hunger Vital Sign    Worried About Running Out of Food in the Last Year: Never true    [place] in the Last Year: Never true  Transportation Needs: No Transportation Needs (12/22/2021)    PRAPARE - Hydrologist (Medical): No    Lack of Transportation (Non-Medical): No  Physical Activity: Inactive (12/22/2021)   Exercise Vital Sign    Days of Exercise per Week: 0 days    Minutes of Exercise per Session: 0 min  Stress: No Stress Concern Present (12/22/2021)   [place]    Feeling of Stress : Not at all  Social Connections: Moderately Isolated (12/22/2021)   Social Connection and Isolation Panel [NHANES]    Frequency of Communication with Friends and Family: More than three times a week    Frequency of Social Gatherings with Friends and Family: More than three times a week    Attends Religious Services: More than 4 times per year    Active Member of [employer] or Organizations: No    Attends Archivist Meetings: Never    Marital Status: Never married  Intimate Partner Violence: Not At Risk (12/22/2021)   Humiliation, Afraid, Rape, and Kick questionnaire    Fear of Current or Ex-Partner: No    Emotionally Abused: No    Physically Abused: No    Sexually Abused: No    Outpatient Medications Prior to Visit  Medication Sig Dispense Refill   amLODipine (NORVASC) 5 MG tablet Take  1 tablet (5 mg total) by mouth daily. 90 tablet 0   citalopram (CELEXA) 10 MG tablet Take 1 tablet (10 mg total) by mouth daily. 90 tablet 0   fluticasone (FLONASE) 50 MCG/ACT nasal spray Place 2 sprays into both nostrils daily. 16 g 6   hydrochlorothiazide (HYDRODIURIL) 25 MG tablet Take 1 tablet by mouth once daily 90 tablet 0   metoprolol tartrate (LOPRESSOR) 50 MG tablet Take 1 tablet (50 mg total) by mouth daily. 90 tablet 3   Multiple Vitamin (MULTIVITAMIN) tablet Take 1 tablet by mouth daily.     omeprazole (PRILOSEC) 40 MG capsule Take 1 capsule (40 mg total) by mouth daily. 90 capsule 3   ondansetron (ZOFRAN-ODT) 4 MG disintegrating tablet Take 1 tablet (4 mg total) by mouth every 8 (eight) hours  as needed for nausea or vomiting. 20 tablet 0   Vitamin D, Ergocalciferol, (DRISDOL) 1.25 MG (50000 UNIT) CAPS capsule TAKE 1 CAPSULE BY MOUTH ONCE A WEEK (EVERY 7 DAYS) 12 capsule 3   No facility-administered medications prior to visit.    Allergies  Allergen Reactions   Lisinopril Other (See Comments)    Angioedema     Review of Systems  Constitutional:  Positive for fatigue. Negative for fever.  HENT:  Positive for ear pain (left), postnasal drip, rhinorrhea and sore throat.   Respiratory:  Positive for cough. Negative for chest tightness, shortness of breath and wheezing.   Cardiovascular:  Negative for palpitations.  Gastrointestinal:  Negative for nausea.  Allergic/Immunologic: Positive for environmental allergies.       Objective:    Physical Exam Vitals reviewed.  HENT:     Right Ear: Tympanic membrane normal.     Left Ear: Tympanic membrane normal.     Nose: Congestion and rhinorrhea present.     Mouth/Throat:     Pharynx: Posterior oropharyngeal erythema present.  Cardiovascular:     Rate and Rhythm: Normal rate and regular rhythm.  Pulmonary:     Effort: Pulmonary effort is normal.     Breath sounds: Normal breath sounds.  Musculoskeletal:     Cervical back: Neck supple.  Lymphadenopathy:     Cervical: No cervical adenopathy.  Skin:    General: Skin is warm and dry.     Capillary Refill: Capillary refill takes less than 2 seconds.  Neurological:     Mental Status: She is alert.     BP 134/78   Pulse 84   Temp 98.4 F (36.9 C)   Wt 278 lb (126.1 kg)   SpO2 98%   BMI 46.26 kg/m   Wt Readings from Last 3 Encounters:  12/22/21 276 lb (125.2 kg)  12/08/21 280 lb (127 kg)  09/01/21 279 lb 14.4 oz (127 kg)      Lab Results  Component Value Date   TSH 2.420 12/08/2021   Lab Results  Component Value Date   WBC 13.2 (H) 12/08/2021   HGB 11.7 12/08/2021   HCT 35.8 12/08/2021   MCV 84 12/08/2021   PLT 408 12/08/2021   Lab Results   Component Value Date   NA 138 12/08/2021   K 4.5 12/08/2021   CO2 24 12/08/2021   GLUCOSE 91 12/08/2021   BUN 13 12/08/2021   CREATININE 0.98 12/08/2021   BILITOT 0.4 12/08/2021   ALKPHOS 90 12/08/2021   AST 17 12/08/2021   ALT 14 12/08/2021   PROT 7.2 12/08/2021   ALBUMIN 4.1 12/08/2021   CALCIUM 9.4 12/08/2021   EGFR 75 12/08/2021  Lab Results  Component Value Date   CHOL 164 12/08/2021   Lab Results  Component Value Date   HDL 43 12/08/2021   Lab Results  Component Value Date   LDLCALC 101 (H) 12/08/2021   Lab Results  Component Value Date   TRIG 110 12/08/2021   Lab Results  Component Value Date   CHOLHDL 3.8 12/08/2021        Assessment & Plan:   1. Pharyngitis, unspecified etiology - azithromycin (ZITHROMAX) 250 MG tablet; Take 2 tablets on day 1, then 1 tablet daily on days 2 through 5  Dispense: 6 tablet; Refill: 0     Warm salt water gargles as needed Rest and push fluids Continue Tessalon Perles and Mucinex as needed for cough Take Z-pack as directed Replace toothbrush tomorrow Follow-up as needed  Follow-up: PRN  An After Visit Summary was printed and given to the patient.  I, Rip Harbour, NP, have reviewed all documentation for this visit. The documentation on 01/31/22 for the exam, diagnosis, procedures, and orders are all accurate and complete.    Signed, Rip Harbour, NP [place] [phone]

## 2022-02-19 ENCOUNTER — Other Ambulatory Visit: Payer: Self-pay | Admitting: Nurse Practitioner

## 2022-02-19 DIAGNOSIS — I10 Essential (primary) hypertension: Secondary | ICD-10-CM

## 2022-02-21 ENCOUNTER — Other Ambulatory Visit: Payer: Self-pay | Admitting: Nurse Practitioner

## 2022-02-21 DIAGNOSIS — I10 Essential (primary) hypertension: Secondary | ICD-10-CM

## 2022-02-26 ENCOUNTER — Other Ambulatory Visit: Payer: Self-pay | Admitting: Nurse Practitioner

## 2022-02-26 DIAGNOSIS — I10 Essential (primary) hypertension: Secondary | ICD-10-CM

## 2022-03-02 ENCOUNTER — Other Ambulatory Visit: Payer: Managed Care, Other (non HMO)

## 2022-03-02 ENCOUNTER — Ambulatory Visit: Payer: Managed Care, Other (non HMO) | Admitting: Oncology

## 2022-03-29 NOTE — Progress Notes (Signed)
Subjective:  Patient ID: Rebekah Barnes, female    DOB: 11-23-1980  Age: 41 y.o. MRN: [id number]  Chief Complaint  Patient presents with   Hypertension   HPI:  Pt presents for follow-up of HTN, Vit D deficiency, and allergic rhinitis. Pt states she has experienced difficulty sleeping and fatigue upon awakening. She has not undergone a sleep study.    Hypertension, follow-up:  She was last seen for hypertension 3 months ago.  BP at that visit was 142/82. Management includes Norvasc, HCTZ. She reports excellent compliance with treatment. She is not having side effects.  She is following a Regular diet. She is not exercising. She does not smoke.     Use of agents associated with hypertension: none.      Outside blood pressures are not being checked.  Pertinent labs: Lab Results  Component Value Date   CHOL 164 12/08/2021   HDL 43 12/08/2021   LDLCALC 101 (H) 12/08/2021   TRIG 110 12/08/2021   CHOLHDL 3.8 12/08/2021   Lab Results  Component Value Date   NA 138 12/08/2021   K 4.5 12/08/2021   CREATININE 0.98 12/08/2021   EGFR 75 12/08/2021   GFRNONAA 90 07/14/2020   GLUCOSE 91 12/08/2021      GERD, Follow up:  Current treatment consist of: Diet modification, avoiding GERD triggers She reports excellent compliance with treatment. She is not having side effects. . She is NOT experiencing belching and eructation, choking on food, or dysphagia  Vitamin D deficiency, follow-up  Lab Results  Component Value Date   VD25OH 33.6 12/08/2021   VD25OH 19.4 (L) 02/09/2021   CALCIUM 9.4 12/08/2021   CALCIUM 9.0 02/09/2021   Wt Readings from Last 3 Encounters:  01/31/22 278 lb (126.1 kg)  12/22/21 276 lb (125.2 kg)  12/08/21 280 lb (127 kg)    Management since that visit includes Vtamin D 50,000 units daily She reports excellent compliance with treatment. She is not having side effects.     ---------------------------------------------------------------------------------------------------   Depression, Follow-up  Current treatment includes Celexa 10 mg .  She reports excellent compliance with treatment. She is not having side effects.  She reports good tolerance of treatment. Current symptoms include: fatigue She feels she is Unchanged since last visit.     02/09/2021    8:03 AM 02/09/2021    8:00 AM 02/09/2021    7:59 AM  Depression screen PHQ 2/9  Decreased Interest 2 0 0  Down, Depressed, Hopeless 0 0 0  PHQ - 2 Score 2 0 0  Altered sleeping 0 0   Tired, decreased energy 1 1   Change in appetite 0 0   Feeling bad or failure about yourself  0 0   Trouble concentrating 0 0   Moving slowly or fidgety/restless 0 0   Suicidal thoughts 0 0   PHQ-9 Score 3 1   Difficult doing work/chores Not difficult at all Not difficult at all      Current Outpatient Medications on File Prior to Visit  Medication Sig Dispense Refill   amLODipine (NORVASC) 5 MG tablet Take 1 tablet by mouth once daily 90 tablet 0   citalopram (CELEXA) 10 MG tablet Take 1 tablet (10 mg total) by mouth daily. 90 tablet 0   fluticasone (FLONASE) 50 MCG/ACT nasal spray Place 2 sprays into both nostrils daily. 16 g 6   hydrochlorothiazide (HYDRODIURIL) 25 MG tablet Take 1 tablet by mouth once daily 90 tablet 0   metoprolol tartrate (LOPRESSOR) 50  MG tablet Take 1 tablet by mouth once daily 90 tablet 0   Multiple Vitamin (MULTIVITAMIN) tablet Take 1 tablet by mouth daily.     omeprazole (PRILOSEC) 40 MG capsule Take 1 capsule (40 mg total) by mouth daily. 90 capsule 3   ondansetron (ZOFRAN-ODT) 4 MG disintegrating tablet Take 1 tablet (4 mg total) by mouth every 8 (eight) hours as needed for nausea or vomiting. 20 tablet 0   Vitamin D, Ergocalciferol, (DRISDOL) 1.25 MG (50000 UNIT) CAPS capsule TAKE 1 CAPSULE BY MOUTH ONCE A WEEK (EVERY 7 DAYS) 12 capsule 3   No current facility-administered  medications on file prior to visit.   Past Medical History:  Diagnosis Date   Essential (primary) hypertension    Fibroid    Morbid (severe) obesity due to excess calories ([place])    No past surgical history on file.  Family History  Problem Relation Age of Onset   Hypertension Father    Renal Disease Father    Breast cancer Neg Hx    Social History   Socioeconomic History   Marital status: Single    Spouse name: Not on file   Number of children: Not on file   Years of education: Not on file   Highest education level: Not on file  Occupational History   Not on file  Tobacco Use   Smoking status: Never   Smokeless tobacco: Never  Vaping Use   Vaping Use: Never used  Substance and Sexual Activity   Alcohol use: Yes    Comment: Social   Drug use: Never   Sexual activity: Not Currently    Comment: 1st intercourse 41 yo-Fewer than 5 partners  Other Topics Concern   Not on file  Social History Narrative   Not on file   Social Determinants of Health   Financial Resource Strain: Low Risk  (12/22/2021)   Overall Financial Resource Strain (CARDIA)    Difficulty of Paying Living Expenses: Not hard at all  Food Insecurity: No Food Insecurity (12/22/2021)   Hunger Vital Sign    Worried About Running Out of Food in the Last Year: Never true    [place] in the Last Year: Never true  Transportation Needs: No Transportation Needs (12/22/2021)   PRAPARE - Hydrologist (Medical): No    Lack of Transportation (Non-Medical): No  Physical Activity: Inactive (12/22/2021)   Exercise Vital Sign    Days of Exercise per Week: 0 days    Minutes of Exercise per Session: 0 min  Stress: No Stress Concern Present (12/22/2021)   [place]    Feeling of Stress : Not at all  Social Connections: Moderately Isolated (12/22/2021)   Social Connection and Isolation Panel [NHANES]    Frequency of  Communication with Friends and Family: More than three times a week    Frequency of Social Gatherings with Friends and Family: More than three times a week    Attends Religious Services: More than 4 times per year    Active Member of [employer] or Organizations: No    Attends Archivist Meetings: Never    Marital Status: Never married    Review of Systems  Constitutional:  Positive for fatigue.  Eyes:  Positive for discharge (watery eyes).  Allergic/Immunologic: Positive for environmental allergies.  Psychiatric/Behavioral:  Positive for sleep disturbance.      Objective:   BP 138/82   Pulse 97  Temp (!) 97.4 F (36.3 C)   Resp 18   Ht _0  (1.651 m)   Wt 279 lb 9.6 oz (126.8 kg)   SpO2 98%   BMI 46.53 kg/m      01/31/2022    8:39 AM 12/22/2021   10:05 AM 12/14/2021    8:50 AM  BP/Weight  Systolic BP [phone]  Diastolic BP 78 82 82  Wt. (Lbs) 278 276   BMI 46.26 kg/m2 45.93 kg/m2     Physical Exam Vitals reviewed.  HENT:     Head: Normocephalic.     Right Ear: Tympanic membrane normal.     Left Ear: Tympanic membrane normal.     Nose: Nose normal.     Mouth/Throat:     Mouth: Mucous membranes are moist.  Eyes:     Pupils: Pupils are equal, round, and reactive to light.  Cardiovascular:     Rate and Rhythm: Normal rate and regular rhythm.  Pulmonary:     Effort: Pulmonary effort is normal.     Breath sounds: Normal breath sounds.  Abdominal:     General: Bowel sounds are normal.     Palpations: Abdomen is soft.  Musculoskeletal:        General: Normal range of motion.     Cervical back: Neck supple.  Skin:    General: Skin is warm and dry.     Capillary Refill: Capillary refill takes less than 2 seconds.  Neurological:     General: No focal deficit present.     Mental Status: She is alert and oriented to person, place, and time.  Psychiatric:        Mood and Affect: Mood normal.        Behavior: Behavior normal.         Lab Results   Component Value Date   WBC 13.2 (H) 12/08/2021   HGB 11.7 12/08/2021   HCT 35.8 12/08/2021   PLT 408 12/08/2021   GLUCOSE 91 12/08/2021   CHOL 164 12/08/2021   TRIG 110 12/08/2021   HDL 43 12/08/2021   LDLCALC 101 (H) 12/08/2021   ALT 14 12/08/2021   AST 17 12/08/2021   NA 138 12/08/2021   K 4.5 12/08/2021   CL 102 12/08/2021   CREATININE 0.98 12/08/2021   BUN 13 12/08/2021   CO2 24 12/08/2021   TSH 2.420 12/08/2021      Assessment & Plan:   1. Essential hypertension-well controlled - CBC with Differential/Platelet - Comprehensive metabolic panel - Lipid panel - Home sleep test -continue Norvasc, HCTZ, and Metoprolol  2. Gastroesophageal reflux disease, unspecified whether esophagitis present-well controlled -continue to avoids foods that trigger GERD  3. Vitamin D deficiency-well controlled - VITAMIN D 25 Hydroxy (Vit-D Deficiency, Fractures) -continue Vit D rich diet  4. Depression, recurrent (HCC)-well controlled -continue Citalopram 10 mg QD -recommend counseling  5. Need for vaccination - Flu Vaccine QUAD 41moIM (Fluarix, Fluzone & Alfiuria Quad PF)  6. Sleep disorder - Home sleep test  7. Irregular menstrual bleeding - Ambulatory referral to Obstetrics / Gynecology  8. Uterine leiomyoma, unspecified location - Ambulatory referral to Obstetrics / Gynecology   We call you with lab results and sleep study, ob-gyn referral Continue medications Follow-up in 632-month    Follow-up: 6-45-monthfasting   An After Visit Summary was printed and given to the patient.  I, ShaRip HarbourP, have reviewed all documentation for this visit. The documentation on 03/30/22 for the exam,  diagnosis, procedures, and orders are all accurate and complete.   Signed, Rip Harbour, NP [facility] [phone]

## 2022-03-30 ENCOUNTER — Encounter: Payer: Self-pay | Admitting: Nurse Practitioner

## 2022-03-30 ENCOUNTER — Ambulatory Visit (INDEPENDENT_AMBULATORY_CARE_PROVIDER_SITE_OTHER): Payer: Commercial Managed Care - HMO | Admitting: Nurse Practitioner

## 2022-03-30 VITALS — BP 138/82 | HR 97 | Temp 97.4°F | Resp 18 | Ht 65.0 in | Wt 279.6 lb

## 2022-03-30 DIAGNOSIS — K219 Gastro-esophageal reflux disease without esophagitis: Secondary | ICD-10-CM | POA: Diagnosis not present

## 2022-03-30 DIAGNOSIS — I10 Essential (primary) hypertension: Secondary | ICD-10-CM | POA: Diagnosis not present

## 2022-03-30 DIAGNOSIS — Z23 Encounter for immunization: Secondary | ICD-10-CM | POA: Diagnosis not present

## 2022-03-30 DIAGNOSIS — F339 Major depressive disorder, recurrent, unspecified: Secondary | ICD-10-CM | POA: Diagnosis not present

## 2022-03-30 DIAGNOSIS — N926 Irregular menstruation, unspecified: Secondary | ICD-10-CM

## 2022-03-30 DIAGNOSIS — E559 Vitamin D deficiency, unspecified: Secondary | ICD-10-CM

## 2022-03-30 DIAGNOSIS — D259 Leiomyoma of uterus, unspecified: Secondary | ICD-10-CM

## 2022-03-30 DIAGNOSIS — G479 Sleep disorder, unspecified: Secondary | ICD-10-CM

## 2022-03-30 NOTE — Patient Instructions (Addendum)
We call you with lab results and sleep study, ob-gyn referral Continue medications Follow-up in 42-month  Sleep Study, Adult A sleep study (polysomnogram) is a series of tests done while you are sleeping. A sleep study records your brain waves, heart rate, breathing rate, oxygen level, and eye and leg movements. A sleep study helps your health care provider: See how well you sleep. Diagnose a sleep disorder. Determine how severe your sleep disorder is. Create a plan to treat your sleep disorder. Your health care provider may recommend a sleep study if you: Feel sleepy on most days. Snore loudly while sleeping. Have unusual behaviors while you sleep, such as walking. Have brief periods in which you stop breathing during sleep (sleepapnea). Fall asleep suddenly during the day (narcolepsy). Have trouble falling asleep or staying asleep (insomnia). Feel like you need to move your legs when trying to fall asleep (restless legs syndrome). Move your legs by flexing and extending them regularly while asleep (periodic limb movement disorder). Act out your dreams while you sleep (sleep behavior disorder). Feel like you cannot move when you first wake up (sleep paralysis). What tests are part of a sleep study? Most sleep studies record the following during sleep: Brain activity. Eye movements. Heart rate and rhythm. Breathing rate and rhythm. Blood-oxygen level. Blood pressure. Chest and belly movement as you breathe. Arm and leg movements. Snoring or other noises. Body position. Where are sleep studies done? Sleep studies are done at sleep centers. A sleep center may be inside a hospital, office, or clinic. The room where you have the study may look like a hospital room or a hotel room. The health care providers doing the study may come in and out of the room during the study. Most of the time, they will be in another room monitoring your test as you sleep. How are sleep studies  done? Most sleep studies are done during a normal period of time for a full night of sleep. You will arrive at the study center in the evening and go home in the morning. Before the test Bring your pajamas and toothbrush with you to the sleep study. Do not have caffeine on the day of your sleep study. Do not drink alcohol on the day of your sleep study. Your health care provider will let you know if you should stop taking any of your regular medicines before the test. During the test     Round, sticky patches with sensors attached to recording wires (electrodes) are placed on your scalp, face, chest, and limbs. Wires from all the electrodes and sensors run from your bed to a computer. The wires can be taken off and put back on if you need to get out of bed to go to the bathroom. A sensor is placed over your nose to measure airflow. A finger clip is put on your finger or ear to measure your blood oxygen level (pulse oximetry). A belt is placed around your belly and a belt is placed around your chest to measure breathing movements. If you have signs of the sleep disorder called sleep apnea during your test, you may get a treatment mask to wear for the second half of the night. The mask provides positive airway pressure (PAP) to help you breathe better during sleep. This may greatly improve your sleep apnea. You will then have all tests done again with the mask in place to see if your measurements and recordings change. After the test A medical doctor who specializes  in sleep will evaluate the results of your sleep study and share them with you and your primary health care provider. Based on your results, your medical history, and a physical exam, you may be diagnosed with a sleep disorder, such as: Sleep apnea. Restless legs syndrome. Sleep-related behavior disorder. Sleep-related movement disorders. Sleep-related seizure disorders. Your health care team will help determine your treatment  options based on your diagnosis. This may include: Improving your sleep habits (sleep hygiene). Wearing a continuous positive airway pressure (CPAP) or bi-level positive airway pressure (BIPAP) mask. Wearing an oral device at night to improve breathing and reduce snoring. Taking medicines. Follow these instructions at home: Take over-the-counter and prescription medicines only as told by your health care provider. If you are instructed to use a CPAP or BIPAP mask, make sure you use it nightly as directed. Make any lifestyle changes that your health care provider recommends. If you were given a device to open your airway while you sleep, use it only as told by your health care provider. Do not use any tobacco products, such as cigarettes, chewing tobacco, and e-cigarettes. If you need help quitting, ask your health care provider. Keep all follow-up visits as told by your health care provider. This is important. Summary A sleep study (polysomnogram) is a series of tests done while you are sleeping. It shows how well you sleep. Most sleep studies are done over one full night of sleep. You will arrive at the study center in the evening and go home in the morning. If you have signs of the sleep disorder called sleep apnea during your test, you may get a treatment mask to wear for the second half of the night. A medical doctor who specializes in sleep will evaluate the results of your sleep study and share them with your primary health care provider. This information is not intended to replace advice given to you by your health care provider. Make sure you discuss any questions you have with your health care provider. Document Revised: 06/06/2021 Document Reviewed: 05/22/2021 Elsevier Patient Education  [place].    Vitamin D Deficiency Vitamin D deficiency is when your body does not have enough vitamin D. Vitamin D is important because: It helps your body use certain minerals. It helps  to keep your bones healthy. It lessens irritation and swelling (inflammation). It helps the body's defense system (immune system) work better. Not getting enough vitamin D can make your bones soft. What are the causes? Not eating enough foods that have vitamin D in them. Not getting enough sun. Having diseases that make it hard for your body to take in vitamin D. Having had part of your stomach or part of your small intestine taken out. What increases the risk? Being an older adult. Not spending much time outdoors. Living in a long-term care center. Having dark skin. Taking certain medicines. Being overweight or very overweight (obese). Having long-term (chronic) kidney or liver disease. What are the signs or symptoms? In mild cases, there may be no symptoms. If the condition is very bad, symptoms may include: Bone pain. Muscle pain. Not being able to walk normally. Bones that break easily. Joint pain. How is this treated? Treatment may include taking supplements as told by your doctor. Your doctor will tell you what dose is best for you. This may include taking: Vitamin D. Calcium. Follow these instructions at home: Eating and drinking Eat foods that have vitamin D in them, such as: Dairy products, cereals, or  juices that have vitamin D added to them (are fortified). Check the label. Fish, such as salmon or trout. Eggs. The vitamin D is in the yolk. Mushrooms that were treated with UV light. Beef liver. The items listed above may not be a complete list of foods and beverages you can eat and drink. Contact a dietitian for more information. General instructions Take over-the-counter and prescription medicines only as told by your doctor. Take supplements only as told by your doctor. Get sunlight in a safe way. Do not use a tanning bed. Stay at a healthy weight. Lose weight if you need to. Keep all follow-up visits. How is this prevented? Eating foods that naturally have  vitamin D in them. Eating or drinking foods and drinks that have vitamin D added to them, such as cereals, juices, and milk. Taking vitamin D or a multivitamin that has vitamin D in it. Being in the sun. Your body makes vitamin D when your skin gets sunlight. Contact a doctor if: Your symptoms do not go away. You feel like you may vomit (nauseous). You vomit. You poop less often than normal, or you have trouble pooping (constipation). Summary Vitamin D deficiency is when your body does not have enough vitamin D. Vitamin D helps to keep your bones healthy. This condition is often treated by taking a supplement. Your doctor will tell you what dose is best for you. This information is not intended to replace advice given to you by your health care provider. Make sure you discuss any questions you have with your health care provider. Document Revised: 04/28/2021 Document Reviewed: 04/28/2021 Elsevier Patient Education  [place].

## 2022-03-31 LAB — LIPID PANEL
Chol/HDL Ratio: 3.7 ratio (ref 0.0–4.4)
Cholesterol, Total: 179 mg/dL (ref 100–199)
HDL: 48 mg/dL (ref >39)
LDL Chol Calc (NIH): 112 mg/dL — ABNORMAL HIGH (ref 0–99)
Triglycerides: 102 mg/dL (ref 0–149)
VLDL Cholesterol Cal: 19 mg/dL (ref 5–40)

## 2022-03-31 LAB — VITAMIN D 25 HYDROXY (VIT D DEFICIENCY, FRACTURES): Vit D, 25-Hydroxy: 31.7 ng/mL (ref 30.0–100.0)

## 2022-03-31 LAB — COMPREHENSIVE METABOLIC PANEL
ALT: 10 IU/L (ref 0–32)
AST: 17 IU/L (ref 0–40)
Albumin/Globulin Ratio: 1.2 (ref 1.2–2.2)
Albumin: 4 g/dL (ref 3.9–4.9)
Alkaline Phosphatase: 85 IU/L (ref 44–121)
BUN/Creatinine Ratio: 11 (ref 9–23)
BUN: 9 mg/dL (ref 6–24)
Bilirubin Total: 0.3 mg/dL (ref 0.0–1.2)
CO2: 23 mmol/L (ref 20–29)
Calcium: 9.3 mg/dL (ref 8.7–10.2)
Chloride: 101 mmol/L (ref 96–106)
Creatinine, Ser: 0.83 mg/dL (ref 0.57–1.00)
Globulin, Total: 3.3 g/dL (ref 1.5–4.5)
Glucose: 96 mg/dL (ref 70–99)
Potassium: 4.1 mmol/L (ref 3.5–5.2)
Sodium: 138 mmol/L (ref 134–144)
Total Protein: 7.3 g/dL (ref 6.0–8.5)
eGFR: 91 mL/min/{1.73_m2} (ref >59)

## 2022-03-31 LAB — CBC WITH DIFFERENTIAL/PLATELET
Basophils Absolute: 0.1 10*3/uL (ref 0.0–0.2)
Basos: 0 %
EOS (ABSOLUTE): 0.5 10*3/uL — ABNORMAL HIGH (ref 0.0–0.4)
Eos: 4 %
Hematocrit: 32.8 % — ABNORMAL LOW (ref 34.0–46.6)
Hemoglobin: 10.6 g/dL — ABNORMAL LOW (ref 11.1–15.9)
Immature Grans (Abs): 0 10*3/uL (ref 0.0–0.1)
Immature Granulocytes: 0 %
Lymphocytes Absolute: 3.7 10*3/uL — ABNORMAL HIGH (ref 0.7–3.1)
Lymphs: 25 %
MCH: 26.9 pg (ref 26.6–33.0)
MCHC: 32.3 g/dL (ref 31.5–35.7)
MCV: 83 fL (ref 79–97)
Monocytes Absolute: 1.2 10*3/uL — ABNORMAL HIGH (ref 0.1–0.9)
Monocytes: 8 %
Neutrophils Absolute: 9.4 10*3/uL — ABNORMAL HIGH (ref 1.4–7.0)
Neutrophils: 63 %
Platelets: 454 10*3/uL — ABNORMAL HIGH (ref 150–450)
RBC: 3.94 x10E6/uL (ref 3.77–5.28)
RDW: 13.7 % (ref 11.7–15.4)
WBC: 14.9 10*3/uL — ABNORMAL HIGH (ref 3.4–10.8)

## 2022-03-31 LAB — CARDIOVASCULAR RISK ASSESSMENT

## 2022-04-05 LAB — FERRITIN: Ferritin: 21 ng/mL (ref 15–150)

## 2022-04-05 LAB — IRON AND TIBC
Iron Saturation: 7 % — CL (ref 15–55)
Iron: 28 ug/dL (ref 27–159)
Total Iron Binding Capacity: 375 ug/dL (ref 250–450)
UIBC: 347 ug/dL (ref 131–425)

## 2022-04-05 LAB — SPECIMEN STATUS REPORT

## 2022-04-12 ENCOUNTER — Other Ambulatory Visit: Payer: Self-pay

## 2022-05-26 ENCOUNTER — Other Ambulatory Visit: Payer: Self-pay | Admitting: Nurse Practitioner

## 2022-05-26 DIAGNOSIS — I10 Essential (primary) hypertension: Secondary | ICD-10-CM

## 2022-09-11 ENCOUNTER — Other Ambulatory Visit: Payer: Self-pay

## 2022-09-11 DIAGNOSIS — I10 Essential (primary) hypertension: Secondary | ICD-10-CM

## 2022-09-11 MED ORDER — HYDROCHLOROTHIAZIDE 25 MG PO TABS: 25.0000 mg | ORAL_TABLET | Freq: Every day | ORAL | 0 refills | Status: DC

## 2022-10-05 ENCOUNTER — Ambulatory Visit: Payer: Commercial Managed Care - HMO | Admitting: Nurse Practitioner

## 2022-11-05 ENCOUNTER — Ambulatory Visit: Payer: Commercial Managed Care - HMO | Admitting: Nurse Practitioner

## 2022-11-08 ENCOUNTER — Other Ambulatory Visit: Payer: Self-pay

## 2022-11-08 DIAGNOSIS — I10 Essential (primary) hypertension: Secondary | ICD-10-CM

## 2022-11-08 MED ORDER — METOPROLOL TARTRATE 50 MG PO TABS: 50.0000 mg | ORAL_TABLET | Freq: Every day | ORAL | 0 refills | Status: DC

## 2023-02-05 ENCOUNTER — Telehealth: Payer: Self-pay

## 2023-02-05 ENCOUNTER — Other Ambulatory Visit: Payer: Self-pay

## 2023-02-05 DIAGNOSIS — I10 Essential (primary) hypertension: Secondary | ICD-10-CM

## 2023-02-05 MED ORDER — AMLODIPINE BESYLATE 5 MG PO TABS: 5.0000 mg | ORAL_TABLET | Freq: Every day | ORAL | 0 refills | Status: DC

## 2023-02-05 NOTE — Telephone Encounter (Signed)
[facility] called stating that they had the patient in the office and that we were needing to do something about her High Blood pressure. They stated that she had Papilledema notated behind the eyes and did not know what it was due to. Patient was not taking all bp medications as prescribed, and the patient had not come in for follow up on her BP and had not been seen in over 1 year.Patient was scheduled an appointment to follow up on her BP next week with Langley Gauss on 02/15/23.

## 2023-02-05 NOTE — Progress Notes (Unsigned)
Subjective:  Patient ID: Rebekah Barnes, female    DOB: Jun 28, 1981  Age: 42 y.o. MRN: [id number]  No chief complaint on file.   HPI   Pt presents for follow-up of HTN, Vit D deficiency, and allergic rhinitis.   Hypertension, follow-up:  She was last seen for hypertension 9 months ago.  BP at that visit was  Management includes Norvasc, hydrochlorothiazide, Metoprolol. She reports excellent compliance with treatment. She is not having side effects.  She is following a Regular diet. She is not exercising. She does not smoke.     Use of agents associated with hypertension: none.      Outside blood pressures are not being checked.   GERD, Follow up:  Current treatment consist of: Diet modification, avoiding GERD triggers She reports excellent compliance with treatment. She is not having side effects. . She is NOT experiencing belching and eructation, choking on food, or dysphagia  Vitamin D deficiency, follow-up   Management since that visit includes Vtamin D 50,000 units daily She reports excellent compliance with treatment. She is not having side effects.    Depression, Follow-up  Current treatment includes Celexa 10 mg .  She reports excellent compliance with treatment. She is not having side effects.  She reports good tolerance of treatment. Current symptoms include: fatigue She feels she is Unchanged since last visit.     03/30/2022    7:51 AM 02/09/2021    8:03 AM 02/09/2021    8:00 AM 02/09/2021    7:59 AM 11/30/2020    8:14 AM  Depression screen PHQ 2/9  Decreased Interest 1 2 0 0 0  Down, Depressed, Hopeless 0 0 0 0 0  PHQ - 2 Score 1 2 0 0 0  Altered sleeping 1 0 0  0  Tired, decreased energy 1 1 1   0  Change in appetite 0 0 0  0  Feeling bad or failure about yourself  0 0 0  0  Trouble concentrating 0 0 0  0  Moving slowly or fidgety/restless 0 0 0  0  Suicidal thoughts 0 0 0  0  PHQ-9 Score 3 3 1   0  Difficult doing work/chores Not difficult at all  Not difficult at all Not difficult at all  Not difficult at all        03/30/2022    7:50 AM  Fall Risk   Falls in the past year? 0  Number falls in past yr: 0  Injury with Fall? 0  Risk for fall due to : No Fall Risks  Follow up Falls evaluation completed    Patient Care Team: Janie Morning, NP (Inactive) as PCP - General (Nurse Practitioner)   Review of Systems  Current Outpatient Medications on File Prior to Visit  Medication Sig Dispense Refill   amLODipine (NORVASC) 5 MG tablet Take 1 tablet (5 mg total) by mouth daily. 90 tablet 0   citalopram (CELEXA) 10 MG tablet Take 1 tablet (10 mg total) by mouth daily. 90 tablet 0   fluticasone (FLONASE) 50 MCG/ACT nasal spray Place 2 sprays into both nostrils daily. 16 g 6   hydrochlorothiazide (HYDRODIURIL) 25 MG tablet Take 1 tablet (25 mg total) by mouth daily. 90 tablet 0   metoprolol tartrate (LOPRESSOR) 50 MG tablet Take 1 tablet (50 mg total) by mouth daily. 90 tablet 0   Multiple Vitamin (MULTIVITAMIN) tablet Take 1 tablet by mouth daily.     ondansetron (ZOFRAN-ODT) 4 MG disintegrating tablet Take 1  tablet (4 mg total) by mouth every 8 (eight) hours as needed for nausea or vomiting. 20 tablet 0   Vitamin D, Ergocalciferol, (DRISDOL) 1.25 MG (50000 UNIT) CAPS capsule TAKE 1 CAPSULE BY MOUTH ONCE A WEEK (EVERY 7 DAYS) 12 capsule 3   No current facility-administered medications on file prior to visit.   Past Medical History:  Diagnosis Date   Essential (primary) hypertension    Fibroid    Morbid (severe) obesity due to excess calories (HCC)    No past surgical history on file.  Family History  Problem Relation Age of Onset   Hypertension Father    Renal Disease Father    Breast cancer Neg Hx    Social History   Socioeconomic History   Marital status: Single    Spouse name: Not on file   Number of children: Not on file   Years of education: Not on file   Highest education level: Not on file  Occupational  History   Not on file  Tobacco Use   Smoking status: Never   Smokeless tobacco: Never  Vaping Use   Vaping Use: Never used  Substance and Sexual Activity   Alcohol use: Yes    Comment: Social   Drug use: Never   Sexual activity: Not Currently    Comment: 1st intercourse 42 yo-Fewer than 5 partners  Other Topics Concern   Not on file  Social History Narrative   Not on file   Social Determinants of Health   Financial Resource Strain: Low Risk  (12/22/2021)   Overall Financial Resource Strain (CARDIA)    Difficulty of Paying Living Expenses: Not hard at all  Food Insecurity: No Food Insecurity (12/22/2021)   Hunger Vital Sign    Worried About Running Out of Food in the Last Year: Never true    Ran Out of Food in the Last Year: Never true  Transportation Needs: No Transportation Needs (12/22/2021)   PRAPARE - Administrator, Civil Service (Medical): No    Lack of Transportation (Non-Medical): No  Physical Activity: Inactive (12/22/2021)   Exercise Vital Sign    Days of Exercise per Week: 0 days    Minutes of Exercise per Session: 0 min  Stress: No Stress Concern Present (12/22/2021)   [employer] of Occupational Health - Occupational Stress Questionnaire    Feeling of Stress : Not at all  Social Connections: Moderately Isolated (12/22/2021)   Social Connection and Isolation Panel [NHANES]    Frequency of Communication with Friends and Family: More than three times a week    Frequency of Social Gatherings with Friends and Family: More than three times a week    Attends Religious Services: More than 4 times per year    Active Member of [employer] or Organizations: No    Attends Banker Meetings: Never    Marital Status: Never married    Objective:  There were no vitals taken for this visit.     03/30/2022    7:48 AM 01/31/2022    8:39 AM 12/22/2021   10:05 AM  BP/Weight  Systolic BP 138 134 124  Diastolic BP 82 78 82  Wt. (Lbs) 279.6 278 276  BMI  46.53 kg/m2 46.26 kg/m2 45.93 kg/m2    Physical Exam  Diabetic Foot Exam - Simple   No data filed      Lab Results  Component Value Date   WBC 14.9 (H) 03/30/2022   HGB 10.6 (L) 03/30/2022  HCT 32.8 (L) 03/30/2022   PLT 454 (H) 03/30/2022   GLUCOSE 96 03/30/2022   CHOL 179 03/30/2022   TRIG 102 03/30/2022   HDL 48 03/30/2022   LDLCALC 112 (H) 03/30/2022   ALT 10 03/30/2022   AST 17 03/30/2022   NA 138 03/30/2022   K 4.1 03/30/2022   CL 101 03/30/2022   CREATININE 0.83 03/30/2022   BUN 9 03/30/2022   CO2 23 03/30/2022   TSH 2.420 12/08/2021      Assessment & Plan:    Essential hypertension  Gastroesophageal reflux disease, unspecified whether esophagitis present  Vitamin D deficiency     No orders of the defined types were placed in this encounter.   No orders of the defined types were placed in this encounter.    Follow-up: No follow-ups on file.   I,Marla I Leal-Borjas,acting as a scribe for [employer], PA.,have documented all relevant documentation on the behalf of Langley Gauss, PA,as directed by  Langley Gauss, PA while in the presence of Langley Gauss, [place].   An After Visit Summary was printed and given to the patient.  Langley Gauss, [place] [facility] [phone]

## 2023-02-05 NOTE — Telephone Encounter (Signed)
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

## 2023-02-06 ENCOUNTER — Ambulatory Visit (INDEPENDENT_AMBULATORY_CARE_PROVIDER_SITE_OTHER): Payer: 59 | Admitting: Physician Assistant

## 2023-02-06 ENCOUNTER — Encounter: Payer: Self-pay | Admitting: Physician Assistant

## 2023-02-06 VITALS — BP 118/56 | HR 104 | Temp 97.6°F | Ht 65.0 in | Wt 276.0 lb

## 2023-02-06 DIAGNOSIS — Z1231 Encounter for screening mammogram for malignant neoplasm of breast: Secondary | ICD-10-CM | POA: Diagnosis not present

## 2023-02-06 DIAGNOSIS — N926 Irregular menstruation, unspecified: Secondary | ICD-10-CM

## 2023-02-06 DIAGNOSIS — E559 Vitamin D deficiency, unspecified: Secondary | ICD-10-CM | POA: Diagnosis not present

## 2023-02-06 DIAGNOSIS — I10 Essential (primary) hypertension: Secondary | ICD-10-CM | POA: Diagnosis not present

## 2023-02-06 DIAGNOSIS — K219 Gastro-esophageal reflux disease without esophagitis: Secondary | ICD-10-CM

## 2023-02-06 DIAGNOSIS — H4710 Unspecified papilledema: Secondary | ICD-10-CM

## 2023-02-06 DIAGNOSIS — G4452 New daily persistent headache (NDPH): Secondary | ICD-10-CM

## 2023-02-06 DIAGNOSIS — I499 Cardiac arrhythmia, unspecified: Secondary | ICD-10-CM

## 2023-02-06 NOTE — Assessment & Plan Note (Signed)
Uncontrolled Labs drawn today Denies any major side effects with her medications Continue to take Amlodipine 5mg , Metoprolol 50mg , hydrochlorothiazide 25mg 

## 2023-02-06 NOTE — Assessment & Plan Note (Signed)
Referral sent to Neurology with appointment set for 02/11/2023 with Dr. Marjory Lies Continue to follow up with Dr. Margretta Ditty

## 2023-02-06 NOTE — Assessment & Plan Note (Signed)
Labs drawn today for anemia Will adjust treatment based on results

## 2023-02-06 NOTE — Assessment & Plan Note (Addendum)
Heard on Physical exam Referral sent to Cardiology Currently asymptomatic

## 2023-02-07 LAB — CBC WITH DIFFERENTIAL/PLATELET
Basophils Absolute: 0.1 10*3/uL (ref 0.0–0.2)
Basos: 1 %
EOS (ABSOLUTE): 0.3 10*3/uL (ref 0.0–0.4)
Eos: 3 %
Hematocrit: 25.8 % — ABNORMAL LOW (ref 34.0–46.6)
Hemoglobin: 7.3 g/dL — ABNORMAL LOW (ref 11.1–15.9)
Immature Grans (Abs): 0.1 10*3/uL (ref 0.0–0.1)
Immature Granulocytes: 1 %
Lymphocytes Absolute: 2.6 10*3/uL (ref 0.7–3.1)
Lymphs: 21 %
MCH: 19.3 pg — ABNORMAL LOW (ref 26.6–33.0)
MCHC: 28.3 g/dL — ABNORMAL LOW (ref 31.5–35.7)
MCV: 68 fL — ABNORMAL LOW (ref 79–97)
Monocytes Absolute: 0.9 10*3/uL (ref 0.1–0.9)
Monocytes: 7 %
Neutrophils Absolute: 8.8 10*3/uL — ABNORMAL HIGH (ref 1.4–7.0)
Neutrophils: 67 %
Platelets: 554 10*3/uL — ABNORMAL HIGH (ref 150–450)
RBC: 3.78 x10E6/uL (ref 3.77–5.28)
RDW: 17 % — ABNORMAL HIGH (ref 11.7–15.4)
WBC: 12.7 10*3/uL — ABNORMAL HIGH (ref 3.4–10.8)

## 2023-02-07 LAB — COMPREHENSIVE METABOLIC PANEL
ALT: 10 IU/L (ref 0–32)
AST: 16 IU/L (ref 0–40)
Albumin: 4.1 g/dL (ref 3.9–4.9)
Alkaline Phosphatase: 88 IU/L (ref 44–121)
BUN/Creatinine Ratio: 16 (ref 9–23)
BUN: 14 mg/dL (ref 6–24)
Bilirubin Total: 0.3 mg/dL (ref 0.0–1.2)
CO2: 20 mmol/L (ref 20–29)
Calcium: 9.3 mg/dL (ref 8.7–10.2)
Chloride: 99 mmol/L (ref 96–106)
Creatinine, Ser: 0.89 mg/dL (ref 0.57–1.00)
Globulin, Total: 3.2 g/dL (ref 1.5–4.5)
Glucose: 55 mg/dL — ABNORMAL LOW (ref 70–99)
Potassium: 4.4 mmol/L (ref 3.5–5.2)
Sodium: 141 mmol/L (ref 134–144)
Total Protein: 7.3 g/dL (ref 6.0–8.5)
eGFR: 83 mL/min/{1.73_m2} (ref >59)

## 2023-02-07 LAB — IRON,TIBC AND FERRITIN PANEL
Ferritin: 10 ng/mL — ABNORMAL LOW (ref 15–150)
Iron Saturation: 5 % — CL (ref 15–55)
Iron: 20 ug/dL — ABNORMAL LOW (ref 27–159)
Total Iron Binding Capacity: 402 ug/dL (ref 250–450)
UIBC: 382 ug/dL (ref 131–425)

## 2023-02-07 LAB — LIPID PANEL
Chol/HDL Ratio: 3.4 ratio (ref 0.0–4.4)
Cholesterol, Total: 150 mg/dL (ref 100–199)
HDL: 44 mg/dL (ref >39)
LDL Chol Calc (NIH): 89 mg/dL (ref 0–99)
Triglycerides: 87 mg/dL (ref 0–149)
VLDL Cholesterol Cal: 17 mg/dL (ref 5–40)

## 2023-02-07 LAB — VITAMIN D 25 HYDROXY (VIT D DEFICIENCY, FRACTURES): Vit D, 25-Hydroxy: 22 ng/mL — ABNORMAL LOW (ref 30.0–100.0)

## 2023-02-11 ENCOUNTER — Telehealth: Payer: Self-pay | Admitting: Neurology

## 2023-02-11 ENCOUNTER — Ambulatory Visit: Payer: 59 | Admitting: Neurology

## 2023-02-11 ENCOUNTER — Encounter: Payer: Self-pay | Admitting: Neurology

## 2023-02-11 VITALS — BP 128/75 | HR 73 | Ht 64.0 in | Wt 271.0 lb

## 2023-02-11 DIAGNOSIS — G08 Intracranial and intraspinal phlebitis and thrombophlebitis: Secondary | ICD-10-CM

## 2023-02-11 DIAGNOSIS — H4710 Unspecified papilledema: Secondary | ICD-10-CM | POA: Diagnosis not present

## 2023-02-11 DIAGNOSIS — G932 Benign intracranial hypertension: Secondary | ICD-10-CM

## 2023-02-11 DIAGNOSIS — G441 Vascular headache, not elsewhere classified: Secondary | ICD-10-CM

## 2023-02-11 DIAGNOSIS — R635 Abnormal weight gain: Secondary | ICD-10-CM

## 2023-02-11 MED ORDER — ALPRAZOLAM 0.25 MG PO TABS: ORAL_TABLET | ORAL | 0 refills | Status: AC

## 2023-02-11 NOTE — Patient Instructions (Addendum)
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 Also vitamin D 22 needs supplementation. Iron 20, iron saturation 10, ferritin 10. Informed her pcp - will [facility]  - She snores, she feels tired during the day, but feels refreshed in the morning and not excessively tired during the day: monitor and if worsens suggest sleep test - MRI is July 14th in [facility], ordered [facility] for opening pressure - Ordered CTV for venous stenosis or thrombosis  Orders Placed This Encounter  Procedures   CT VENOGRAM HEAD   DG FLUORO GUIDED LOC OF NEEDLE/CATH TIP FOR SPINAL INJECT LT   TSH Rfx on Abnormal to Free T4   Ambulatory referral to [facility]     Weight loss is critical, discussed weight loss and risk of permanent vision loss  If negative above then CTV for venous thrombosis and start Diamox 250 bid and increase to 500mg  bid at next appointment.  For any acute change especially worsening headache or vision loss call 911 and proceed to ED   Idiopathic Intracranial Hypertension  Idiopathic intracranial hypertension (IIH) is a condition that increases pressure around the brain. The fluid that surrounds the brain and spinal cord (cerebrospinal fluid, or CSF) increases and causes the pressure. Idiopathic means that the cause of this condition is not known. IIH affects the brain and spinal cord. If this condition is not treated, it can cause vision loss or blindness. What are the causes? The cause of this condition is not known. What increases the risk? The following factors may make you more likely to develop this condition: Being obese. Being a person who is female, between the ages of 33 and 15 years old, and who has not gone through  menopause. Taking certain medicines, such as birth control, acne medicines, or steroids. What are the signs or symptoms? Symptoms of this condition include: Headaches. This is the most common symptom. Brief periods of total blindness. Double vision, blurred vision, or poor side (peripheral) vision. Pain in the shoulders or neck. Nausea and vomiting. A sound like rushing water or a pulsing sound within the ears (pulsatile tinnitus), or ringing in the ears. How is this diagnosed? This condition may be diagnosed based on: Your symptoms and medical history. Imaging tests of the brain, such as: CT scan. MRI. Magnetic resonance venogram (MRV) to check the veins. Diagnostic lumbar puncture. This is a procedure to remove and examine a sample of CSF. This procedure can determine whether your fluid pressure is too high. An eye exam to check for swelling or nerve damage in the eyes. How is this treated? Treatment for this condition depends on the symptoms. The goal of treatment is to decrease the pressure around your brain. Common treatments include: Weight loss through healthy eating, salt restriction, and exercise, if you are overweight. Medicines to decrease the production of CSF and lower the pressure within your skull. Medicines to prevent or treat headaches. Other treatments may include: Surgery to place drains (shunts) in your brain to remove extra fluid. Lumbar puncture to remove extra CSF. Follow these instructions at home: If you are overweight or obese, work with your health care provider to lose weight. Take over-the-counter and prescription medicines only as told by your health care provider. Ask your health care  provider if the medicine prescribed to you requires you to avoid driving or using machinery. Do not use any products that contain nicotine or tobacco. These products include cigarettes, chewing tobacco, and vaping devices, such as e-cigarettes. If you need help quitting, ask  your health care provider. Keep all follow-up visits. Your health care provider will need to monitor you regularly. Contact a health care provider if: You have changes in your vision, such as: Double vision. Blurred vision. Poor peripheral vision. Get help right away if: You have any of the following symptoms and they get worse or do not get better: Headaches. Nausea. Vomiting. Sudden trouble seeing. This information is not intended to replace advice given to you by your health care provider. Make sure you discuss any questions you have with your health care provider.  Acetazolamide Tablets What is this medication? ACETAZOLAMIDE (a set a ZOLE a mide) reduces swelling related to heart disease. It may also be used to reduce swelling caused by medications. It helps your kidneys remove more fluid and salt from your blood through the urine. It may also be used to treat conditions with increased pressure of the eye, such as glaucoma. It can be used with other medications to prevent and control seizures in people with epilepsy. It can also be used to prevent or treat symptoms of altitude sickness. It works by increasing the amount of oxygen in your body. It belongs to a group of medications called diuretics. This medicine may be used for other purposes; ask your health care provider or pharmacist if you have questions. COMMON BRAND NAME(S): Diamox What should I tell my care team before I take this medication? They need to know if you have any of these conditions: Glaucoma Kidney disease Liver disease Low adrenal gland function Lung or breathing disease An unusual or allergic reaction to acetazolamide, other medications, foods, dyes, or preservatives Pregnant or trying to get pregnant Breast-feeding How should I use this medication? Take this medication by mouth. Take it as directed on the prescription label at the same time every day. You can take it with or without food. If it upsets your  stomach, take it with food. Keep taking it unless your care team tells you to stop. Talk to your care team about the use of this medication in children. Special care may be needed. Overdosage: If you think you have taken too much of this medicine contact a poison control center or emergency room at once. NOTE: This medicine is only for you. Do not share this medicine with others. What if I miss a dose? If you miss a dose, take it as soon as you can. If it is almost time for your next dose, take only that dose. Do not take double or extra doses. What may interact with this medication? Do not take this medication with any of the following: Methazolamide This medication may also interact with the following: Aspirin and aspirin-like medications Cyclosporine Lithium Medication for diabetes Methenamine Other diuretics Phenytoin Primidone Quinidine Sodium bicarbonate Stimulant medications, such as dextroamphetamine This list may not describe all possible interactions. Give your health care provider a list of all the medicines, herbs, non-prescription drugs, or dietary supplements you use. Also tell them if you smoke, drink alcohol, or use illegal drugs. Some items may interact with your medicine. What should I watch for while using this medication? Visit your care team for regular checks on your progress. Tell your care team if your symptoms do not start to get  better or if they get worse. This medication may cause serious skin reactions. They can happen weeks to months after starting the medication. Contact your care team right away if you notice fevers or flu-like symptoms with a rash. The rash may be red or purple and then turn into blisters or peeling of the skin. Or, you might notice a red rash with swelling of the face, lips, or lymph nodes in your neck or under your arms. This medication may affect your coordination, reaction time, or judgment. Do not drive or operate machinery until you  know how this medication affects you. Sit up or stand slowly to reduce the risk of dizzy or fainting spells. What side effects may I notice from receiving this medication? Side effects that you should report to your care team as soon as possible: Allergic reactions--skin rash, itching, hives, swelling of the face, lips, tongue, or throat Aplastic anemia--unusual weakness or fatigue, dizziness, headache, trouble breathing, increased bleeding or bruising High acid level--trouble breathing, unusual weakness or fatigue, confusion, headache, fast or irregular heartbeat, nausea, vomiting Infection--fever, chills, cough, or sore throat Kidney stones--blood in the urine, pain or trouble passing urine, pain in the lower back or sides Liver injury--right upper belly pain, loss of appetite, nausea, light-colored stool, dark yellow or brown urine, yellowing skin or eyes, unusual weakness or fatigue Low potassium level--muscle pain or cramps, unusual weakness or fatigue, fast or irregular heartbeat, constipation Redness, blistering, peeling, or loosening of the skin, including inside the mouth Side effects that usually do not require medical attention (report to your care team if they continue or are bothersome): Blurry vision Change in taste Loss of appetite Pain, tingling, or numbness in the hands or feet This list may not describe all possible side effects. Call your doctor for medical advice about side effects. You may report side effects to FDA at 1-800-FDA-1088. Where should I keep my medication? Keep out of the reach of children and pets. Store at room temperature between 20 and 25 degrees C (68 and 77 degrees F). Throw away any unused medication after the expiration date. NOTE: This sheet is a summary. It may not cover all possible information. If you have questions about this medicine, talk to your doctor, pharmacist, or health care provider.  2024 Elsevier/Gold Standard (2021-09-05  00:00:00)  Document Revised: 12/19/2021 Document Reviewed: 11/28/2021 Elsevier Patient Education  2024 [employer].

## 2023-02-11 NOTE — Telephone Encounter (Signed)
sent to GI they obtain [employer] auth [id number] 

## 2023-02-11 NOTE — Progress Notes (Signed)
[facility]    Provider:  Dr Lucia Gaskins Requesting Provider: Langley Gauss, [place] Primary Care Provider:  Langley Gauss, PA  CC:  papilledema  HPI:  Rebekah Barnes is a 42 y.o. female here as requested by Langley Gauss, PA for papilledema. has Annual visit for general adult medical examination with abnormal findings; Essential hypertension; Depression, recurrent (HCC); BMI 45.0-49.9, adult (HCC); Morbid obesity (HCC); Dysmenorrhea; Allergic rhinitis due to allergen; Acute non-recurrent frontal sinusitis; Leucocytosis; Vitamin D deficiency; Screening mammogram for breast cancer; Irregular menstrual bleeding; Papilledema, both eyes; and Cardiac arrhythmia on their problem list.   She saw an optometrist went to Ritesh Poudyal DO not an ophthalmologist(optometrist) on June 29th 2024 just for regular glasses, she went back 02/04/2023 to do more testing including OCT and VF testing. Reviewed report and testing from Dr. Massie Kluver and aided [facility] was 20/30 OU and OS. Pressure was elevated 152/97 in appt bc she was nervous but today and with pcp within normal 128/75 today. Exam significant for funduscopic exam showing AV nicking and vascular tortuosity moderately, AV ratio in both eyes greater than two thirds, periphery in both eyes clear and flat no holes/breaks, otherwise normal, the optic nerve showed optic nerve head margins blurred 360 both OD and OS indicative of papilledema.  The Humphrey threshold visual field testing was within normal limits OD unspecified paracentral scotomas, OS generalized reduction of sensitivity due to large K abrasion and she was given medication for an abrasion, universal fundus photograph showed optic nerve head edema bilaterally with severe vessel tortuosity OD and OS but no retinal hemorrhages.  FPN OCT disc scan showed a significant optic nerve head edema OU with increased RNFL thickness but unclear if it is due to malignant hypertension versus idiopathic intracranial  hypertension or brain tumors.  HVF 24-2 OD within normal limits, OS reduced sensitivity due to large K abrasion and she has been treated for the abrasion, recommend her PA to request for brain MRI and also spinal tap to rule out IIH.  She has blurry vision with glasses on. She has headaches and dizziness. The headache is a pressure headache, a band around the head mostly in the front area, no hearing changes. No numbness, tingling, headaches are once a week and can last hours and tylenol helps. She has morning headaches, can be positional. She snores a lot and is tired during the day, she recently dad and brother recently and that has put stress which makes since worse. She wasn't taking care of herself but was taking her BP medications, she gained weight recently at least 10 pounds this year maybe more since seeing the eye doctor she hadn;t seen the eye doctor for several years. Anxiety makes it worse. Headaches started a month ago. Stable.   Reviewed notes, labs and imaging from outside physicians, which showed:  Recent Results (from the past 2160 hour(s))  CBC with Differential/Platelet     Status: Abnormal   Collection Time: 02/06/23  9:35 AM  Result Value Ref Range   WBC 12.7 (H) 3.4 - 10.8 x10E3/uL   RBC 3.78 3.77 - 5.28 x10E6/uL   Hemoglobin 7.3 (L) 11.1 - 15.9 g/dL   Hematocrit 40.9 (L) 81.1 - 46.6 %   MCV 68 (L) 79 - 97 fL   MCH 19.3 (L) 26.6 - 33.0 pg   MCHC 28.3 (L) 31.5 - 35.7 g/dL   RDW 91.4 (H) 78.2 - 95.6 %   Platelets 554 (H) 150 - 450 x10E3/uL   Neutrophils 67 Not Estab. %  Lymphs 21 Not Estab. %   Monocytes 7 Not Estab. %   Eos 3 Not Estab. %   Basos 1 Not Estab. %   Neutrophils Absolute 8.8 (H) 1.4 - 7.0 x10E3/uL   Lymphocytes Absolute 2.6 0.7 - 3.1 x10E3/uL   Monocytes Absolute 0.9 0.1 - 0.9 x10E3/uL   EOS (ABSOLUTE) 0.3 0.0 - 0.4 x10E3/uL   Basophils Absolute 0.1 0.0 - 0.2 x10E3/uL   Immature Granulocytes 1 Not Estab. %   Immature Grans (Abs) 0.1 0.0 - 0.1 x10E3/uL   Comprehensive metabolic panel     Status: Abnormal   Collection Time: 02/06/23  9:35 AM  Result Value Ref Range   Glucose 55 (L) 70 - 99 mg/dL   BUN 14 6 - 24 mg/dL   Creatinine, Ser 1.61 0.57 - 1.00 mg/dL   eGFR 83 >09 UE/AVW/0.98   BUN/Creatinine Ratio 16 9 - 23   Sodium 141 134 - 144 mmol/L   Potassium 4.4 3.5 - 5.2 mmol/L   Chloride 99 96 - 106 mmol/L   CO2 20 20 - 29 mmol/L   Calcium 9.3 8.7 - 10.2 mg/dL   Total Protein 7.3 6.0 - 8.5 g/dL   Albumin 4.1 3.9 - 4.9 g/dL   Globulin, Total 3.2 1.5 - 4.5 g/dL   Bilirubin Total 0.3 0.0 - 1.2 mg/dL   Alkaline Phosphatase 88 44 - 121 IU/L   AST 16 0 - 40 IU/L   ALT 10 0 - 32 IU/L  Lipid panel     Status: None   Collection Time: 02/06/23  9:35 AM  Result Value Ref Range   Cholesterol, Total 150 100 - 199 mg/dL   Triglycerides 87 0 - 149 mg/dL   HDL 44 >11 mg/dL   VLDL Cholesterol Cal 17 5 - 40 mg/dL   LDL Chol Calc (NIH) 89 0 - 99 mg/dL   Chol/HDL Ratio 3.4 0.0 - 4.4 ratio    Comment:                                   T. Chol/HDL Ratio                                             Men  Women                               1/2 Avg.Risk  3.4    3.3                                   Avg.Risk  5.0    4.4                                2X Avg.Risk  9.6    7.1                                3X Avg.Risk 23.4   11.0   VITAMIN D 25 Hydroxy (Vit-D Deficiency, Fractures)     Status: Abnormal   Collection Time: 02/06/23  9:35 AM  Result Value Ref Range  Vit D, 25-Hydroxy 22.0 (L) 30.0 - 100.0 ng/mL    Comment: Vitamin D deficiency has been defined by the Institute of Medicine and an Endocrine Society practice guideline as a level of serum 25-OH vitamin D less than 20 ng/mL (1,2). The Endocrine Society went on to further define vitamin D insufficiency as a level between 21 and 29 ng/mL (2). 1. IOM (Institute of Medicine). 2010. Dietary reference    intakes for calcium and D. [place]: The    [employer]. 2. Holick MF,  Binkley [place], Bischoff-Ferrari HA, et al.    Evaluation, treatment, and prevention of vitamin D    deficiency: an Endocrine Society clinical practice    guideline. JCEM. 2011 Jul; 96(7):1911-30.   Iron, TIBC and Ferritin Panel     Status: Abnormal   Collection Time: 02/06/23  9:35 AM  Result Value Ref Range   Total Iron Binding Capacity 402 250 - 450 ug/dL   UIBC 161 096 - 045 ug/dL   Iron 20 (L) 27 - 409 ug/dL   Iron Saturation 5 (LL) 15 - 55 %   Ferritin 10 (L) 15 - 150 ng/mL     Review of Systems: Patient complains of symptoms per HPI as well as the following symptoms anxiety. Pertinent negatives and positives per HPI. All others negative.   Social History   Socioeconomic History   Marital status: Single    Spouse name: Not on file   Number of children: Not on file   Years of education: Not on file   Highest education level: Not on file  Occupational History   Not on file  Tobacco Use   Smoking status: Never   Smokeless tobacco: Never  Vaping Use   Vaping Use: Never used  Substance and Sexual Activity   Alcohol use: Not Currently    Comment: Social, "once in awhile"   Drug use: Never   Sexual activity: Not Currently    Comment: 1st intercourse 42 yo-Fewer than 5 partners  Other Topics Concern   Not on file  Social History Narrative   Caffeine: 3 times a week   Social Determinants of Health   Financial Resource Strain: Low Risk  (12/22/2021)   Overall Financial Resource Strain (CARDIA)    Difficulty of Paying Living Expenses: Not hard at all  Food Insecurity: No Food Insecurity (12/22/2021)   Hunger Vital Sign    Worried About Running Out of Food in the Last Year: Never true    Ran Out of Food in the Last Year: Never true  Transportation Needs: No Transportation Needs (12/22/2021)   PRAPARE - Administrator, Civil Service (Medical): No    Lack of Transportation (Non-Medical): No  Physical Activity: Inactive (02/06/2023)   Exercise Vital Sign    Days  of Exercise per Week: 0 days    Minutes of Exercise per Session: 0 min  Stress: No Stress Concern Present (12/22/2021)   [employer] of Occupational Health - Occupational Stress Questionnaire    Feeling of Stress : Not at all  Social Connections: Moderately Isolated (12/22/2021)   Social Connection and Isolation Panel [NHANES]    Frequency of Communication with Friends and Family: More than three times a week    Frequency of Social Gatherings with Friends and Family: More than three times a week    Attends Religious Services: More than 4 times per year    Active Member of [employer] or Organizations: No    Attends Banker  Meetings: Never    Marital Status: Never married  Intimate Partner Violence: Not At Risk (12/22/2021)   Humiliation, Afraid, Rape, and Kick questionnaire    Fear of Current or Ex-Partner: No    Emotionally Abused: No    Physically Abused: No    Sexually Abused: No    Family History  Problem Relation Age of Onset   Hypertension Father    Renal Disease Father    Heart murmur Other    Breast cancer Neg Hx     Past Medical History:  Diagnosis Date   Essential (primary) hypertension    Fibroid    Heart murmur    suspected per pt, will be seeing cardiology   Morbid (severe) obesity due to excess calories [facility])     Patient Active Problem List   Diagnosis Date Noted   Vitamin D deficiency 02/06/2023   Screening mammogram for breast cancer 02/06/2023   Irregular menstrual bleeding 02/06/2023   Papilledema, both eyes 02/06/2023   Cardiac arrhythmia 02/06/2023   Leucocytosis 10/05/2020   Allergic rhinitis due to allergen 05/05/2020   Acute non-recurrent frontal sinusitis 05/05/2020   Dysmenorrhea 12/19/2019   BMI 45.0-49.9, adult (HCC) 12/08/2019   Morbid obesity (HCC) 12/08/2019   Annual visit for general adult medical examination with abnormal findings 10/22/2019   Essential hypertension 10/22/2019   Depression, recurrent (HCC) 10/22/2019     Past Surgical History:  Procedure Laterality Date   NO PAST SURGERIES      Current Outpatient Medications  Medication Sig Dispense Refill   ALPRAZolam (XANAX) 0.25 MG tablet Take 1-2 tabs (0.25mg -0.50mg ) 30-60 minutes before procedure. May repeat if needed.Do not drive. 4 tablet 0   amLODipine (NORVASC) 5 MG tablet Take 1 tablet (5 mg total) by mouth daily. 90 tablet 0   ciprofloxacin (CILOXAN) 0.3 % ophthalmic solution Place 1 drop into the left eye in the morning, at noon, in the evening, and at bedtime.     hydrochlorothiazide (HYDRODIURIL) 25 MG tablet Take 1 tablet (25 mg total) by mouth daily. 90 tablet 0   metoprolol tartrate (LOPRESSOR) 50 MG tablet Take 1 tablet (50 mg total) by mouth daily. 90 tablet 0   Multiple Vitamin (MULTIVITAMIN) tablet Take 1 tablet by mouth daily.     trimethoprim-polymyxin b (POLYTRIM) ophthalmic solution Place 1 drop into both eyes 4 (four) times daily.     No current facility-administered medications for this visit.    Allergies as of 02/11/2023 - Review Complete 02/11/2023  Allergen Reaction Noted   Lisinopril Other (See Comments) 10/21/2019    Vitals: BP 128/75 (BP Location: Right Arm, Patient Position: Sitting, Cuff Size: Large)   Pulse 73   Ht 5\' 4"  (1.626 m)   Wt 271 lb (122.9 kg)   BMI 46.52 kg/m  Last Weight:  Wt Readings from Last 1 Encounters:  02/11/23 271 lb (122.9 kg)   Last Height:   Ht Readings from Last 1 Encounters:  02/11/23 5\' 4"  (1.626 m)     Physical exam: Exam: Gen: NAD, conversant, well nourised, obese, well groomed                     CV: RRR, no MRG. No Carotid Bruits. No peripheral edema, warm, nontender Eyes: Conjunctivae clear without exudates or hemorrhage  Neuro: Detailed Neurologic Exam  Speech:    Speech is normal; fluent and spontaneous with normal comprehension.  Cognition:    The patient is oriented to person, place, and time;  recent and remote memory intact;     language fluent;      normal attention, concentration,     fund of knowledge Cranial Nerves:    The pupils are equal, round, and reactive to light. 1+-2+ papilledema right appears worse.  Visual fields are full to finger confrontation. Extraocular movements are intact. Trigeminal sensation is intact and the muscles of mastication are normal. The face is symmetric. The palate elevates in the midline. Hearing intact. Voice is normal. Shoulder shrug is normal. The tongue has normal motion without fasciculations.   Coordination: nml  Gait: nml  Motor Observation:    No asymmetry, no atrophy, and no involuntary movements noted. Tone:    Normal muscle tone.    Posture:    Posture is normal. normal erect    Strength:    Strength is V/V in the upper and lower limbs.      Sensation: intact to LT     Reflex Exam:  DTR's:    Deep tendon reflexes in the upper and lower extremities are normal bilaterally.   Toes:    The toes are downgoing bilaterally.   Clonus:    Clonus is absent.    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 Also vitamin D 22 needs supplementation. Iron 20, iron saturation 10, ferritin 10. Informed her pcp - will refer to [facility]  - She snores, she feels tired during the day, but feels refreshed in the morning and not excessively tired during the day: monitor and if worsens suggest sleep test - MRI is July 14th in [facility], ordered LP for opening pressure - Ordered CTV for venous stenosis or thrombosis - continue follow up with eye doctor  - xanax for claustrophobia - [facility] wellness center referral Orders Placed This Encounter  Procedures   CT VENOGRAM HEAD   DG FLUORO GUIDED LOC OF NEEDLE/CATH TIP FOR SPINAL  INJECT LT   TSH Rfx on Abnormal to Free T4   Ambulatory referral to [facility]     Weight loss is critical, discussed weight loss and risk of permanent vision loss  If negative above then CTV for venous thrombosis and start Diamox 250 bid and increase to 500mg  bid at next appointment.  For any acute change especially worsening headache or vision loss call 911 and proceed to ED  To prevent or relieve headaches, try the following: Cool Compress. Lie down and place a cool compress on your head.  Avoid headache triggers. If certain foods or odors seem to have triggered your migraines in the past, avoid them. A headache diary might help you identify triggers.  Include physical activity in your daily routine. Try a daily walk or other moderate aerobic exercise.  Manage stress. Find healthy ways to cope with the stressors, such as delegating tasks on your to-do list.  Practice relaxation techniques. Try deep breathing, yoga, massage and visualization.  Eat regularly. Eating regularly scheduled meals and maintaining a healthy diet might help prevent headaches. Also, drink plenty of fluids.  Follow a regular sleep schedule. Sleep deprivation might contribute to headaches Consider biofeedback. With this mind-body technique, you learn to control certain bodily functions -- such as muscle tension, heart rate and blood pressure -- to prevent headaches or reduce  headache pain.    Proceed to emergency room if you experience new or worsening symptoms or symptoms do not resolve, if you have new neurologic symptoms or if headache is severe, or for any concerning symptom.   Provided education and documentation from American headache Society toolbox including articles on: pseudotumoer cerebri(IIH), chronic migraine medication overuse headache, chronic migraines, prevention of migraines and other headaches, behavioral and other nonpharmacologic treatments for headache.   Cc: Langley Gauss, PA,  [place],  [place], [place]  Naomie Dean, MD  [facility] [address] [place], [place] [address]  Phone [phone] Fax [phone]   I spent over 60 minutes of face-to-face and non-face-to-face time with patient on the  1. IIH (idiopathic intracranial hypertension)   2. Weight gain   3. Morbid obesity (HCC)   4. Papilledema   5. Intracranial and intraspinal phlebitis and thrombophlebitis   6. Cerebral vein thrombosis   7. Other vascular headache    diagnosis.  This included previsit chart review, lab review, study review, order entry, electronic health record documentation, patient education on the different diagnostic and therapeutic options, counseling and coordination of care, risks and benefits of management, compliance, or risk factor reduction

## 2023-02-12 ENCOUNTER — Telehealth (INDEPENDENT_AMBULATORY_CARE_PROVIDER_SITE_OTHER): Payer: 59 | Admitting: Physician Assistant

## 2023-02-12 DIAGNOSIS — D599 Acquired hemolytic anemia, unspecified: Secondary | ICD-10-CM

## 2023-02-12 LAB — TSH RFX ON ABNORMAL TO FREE T4: TSH: 1.9 u[IU]/mL (ref 0.450–4.500)

## 2023-02-12 NOTE — Telephone Encounter (Signed)
Called and discussed lab results with the patient. We also called the Cancer center where her Hematologist Dr.Lewis is located. We set up an appointment for her with Dr.Lewis tomorrow along with receiving 2 units of blood. We will continue to follow her labs and recommendations from Dr.Lewis to manage her anemia.

## 2023-02-12 NOTE — Assessment & Plan Note (Signed)
Set up appointment with Dr.Lewis for tomorrow Will follow up with their recommendations

## 2023-02-12 NOTE — Progress Notes (Signed)
[facility]  [address] [place],  [place]  [address] [phone]  Clinic Day:  02/13/2023  Referring physician: Janie Morning, NP   HISTORY OF PRESENT ILLNESS:  The patient is a 42 y.o. female who I was asked to re-evaluate for anemia.  Labs at her primary care office last week showed a low hemoglobin of 7.5.  Furthermore, her iron parameters were strikingly low.  She claims her menstrual cycles have been heavy, lasting 6-7 days at a time.  Many of those days are particularly heavy.  She has not had a Pap smear/pelvic exam in years.  She denies having other overt forms of blood loss.  Also she feels tired, it is not to the point where she feels she needs a blood transfusion.  PHYSICAL EXAM:  Blood pressure (!) 161/80, pulse 87, temperature 98.4 F (36.9 C), resp. rate 16, height 5\' 4"  (1.626 m), weight 274 lb 6.4 oz (124.5 kg), SpO2 98 %. Wt Readings from Last 3 Encounters:  02/13/23 274 lb 6.4 oz (124.5 kg)  02/11/23 271 lb (122.9 kg)  02/06/23 276 lb (125.2 kg)   Body mass index is 47.1 kg/m. Performance status (ECOG): 0 - Asymptomatic Physical Exam  LABS:      Latest Ref Rng & Units 02/06/2023    9:35 AM 03/30/2022    8:11 AM 12/08/2021    8:18 AM  CBC  WBC 3.4 - 10.8 x10E3/uL 12.7  14.9  13.2   Hemoglobin 11.1 - 15.9 g/dL 7.3  91.4  78.2   Hematocrit 34.0 - 46.6 % 25.8  32.8  35.8   Platelets 150 - 450 x10E3/uL 554  454  408       Latest Ref Rng & Units 02/06/2023    9:35 AM 03/30/2022    8:11 AM 12/08/2021    8:18 AM  CMP  Glucose 70 - 99 mg/dL 55  96  91   BUN 6 - 24 mg/dL 14  9  13    Creatinine 0.57 - 1.00 mg/dL 9.56  2.13  0.86   Sodium 134 - 144 mmol/L 141  138  138   Potassium 3.5 - 5.2 mmol/L 4.4  4.1  4.5   Chloride 96 - 106 mmol/L 99  101  102   CO2 20 - 29 mmol/L 20  23  24    Calcium 8.7 - 10.2 mg/dL 9.3  9.3  9.4   Total Protein 6.0 - 8.5 g/dL 7.3  7.3  7.2   Total Bilirubin 0.0 - 1.2 mg/dL 0.3  0.3  0.4   Alkaline  Phos 44 - 121 IU/L 88  85  90   AST 0 - 40 IU/L 16  17  17    ALT 0 - 32 IU/L 10  10  14      Latest Reference Range & Units 02/06/23 09:35  Iron 27 - 159 ug/dL 20 (L)  UIBC 578 - 469 ug/dL 629  TIBC 528 - 413 ug/dL 244  Ferritin 15 - 010 ng/mL 10 (L)  Iron Saturation 15 - 55 % 5 (LL)  (LL): Data is critically low (L): Data is abnormally low  ASSESSMENT & PLAN:  Assessment/Plan:  A 42 y.o. female whose labs show significant iron deficiency anemia, likely related to her heavy menstrual cycles.  I will arrange for her to receive IV iron within the forthcoming days to rapidly replenish her iron stores and improve her hemoglobin.  She knows she needs to see her gynecologist  to ensure there are no ominous issues behind her heavy menstrual cycles.  Otherwise, I will see her back in 3 months to reassess her labs and see how well she responded to her upcoming IV iron.  The patient understands all the plans discussed today and is in agreement with them.    Francely Craw Kirby Funk, MD

## 2023-02-13 ENCOUNTER — Encounter: Payer: Self-pay | Admitting: Oncology

## 2023-02-13 ENCOUNTER — Inpatient Hospital Stay: Payer: 59

## 2023-02-13 ENCOUNTER — Other Ambulatory Visit: Payer: Self-pay | Admitting: Oncology

## 2023-02-13 ENCOUNTER — Inpatient Hospital Stay: Payer: 59 | Attending: Oncology | Admitting: Oncology

## 2023-02-13 VITALS — BP 161/80 | HR 87 | Temp 98.4°F | Resp 16 | Ht 64.0 in | Wt 274.4 lb

## 2023-02-13 DIAGNOSIS — D509 Iron deficiency anemia, unspecified: Secondary | ICD-10-CM | POA: Insufficient documentation

## 2023-02-13 DIAGNOSIS — D539 Nutritional anemia, unspecified: Secondary | ICD-10-CM

## 2023-02-14 MED FILL — Iron Sucrose Inj 20 MG/ML (Fe Equiv): INTRAVENOUS | Qty: 10 | Status: AC

## 2023-02-15 ENCOUNTER — Ambulatory Visit: Payer: Self-pay | Admitting: Physician Assistant

## 2023-02-15 ENCOUNTER — Encounter: Payer: Self-pay | Admitting: General Practice

## 2023-02-15 ENCOUNTER — Inpatient Hospital Stay: Payer: 59

## 2023-02-15 VITALS — BP 115/66 | HR 74 | Temp 98.4°F | Resp 18 | Ht 64.0 in | Wt 271.0 lb

## 2023-02-15 DIAGNOSIS — D509 Iron deficiency anemia, unspecified: Secondary | ICD-10-CM | POA: Diagnosis not present

## 2023-02-15 MED ORDER — SODIUM CHLORIDE 0.9 % IV SOLN
200.0000 mg | Freq: Once | INTRAVENOUS | Status: AC
  Administered 2023-02-15: 200 mg via INTRAVENOUS
  Filled 2023-02-15: qty 200

## 2023-02-15 MED ORDER — SODIUM CHLORIDE 0.9 % IV SOLN: Freq: Once | INTRAVENOUS | Status: AC

## 2023-02-15 NOTE — Patient Instructions (Signed)

## 2023-02-17 ENCOUNTER — Ambulatory Visit (HOSPITAL_BASED_OUTPATIENT_CLINIC_OR_DEPARTMENT_OTHER): Payer: 59

## 2023-02-18 ENCOUNTER — Encounter: Payer: Self-pay | Admitting: Oncology

## 2023-02-18 MED FILL — Iron Sucrose Inj 20 MG/ML (Fe Equiv): INTRAVENOUS | Qty: 10 | Status: AC

## 2023-02-19 ENCOUNTER — Inpatient Hospital Stay: Payer: 59

## 2023-02-19 VITALS — BP 122/55 | HR 69 | Temp 97.8°F | Resp 18 | Ht 64.0 in | Wt 271.0 lb

## 2023-02-19 DIAGNOSIS — D509 Iron deficiency anemia, unspecified: Secondary | ICD-10-CM | POA: Diagnosis not present

## 2023-02-19 MED ORDER — SODIUM CHLORIDE 0.9 % IV SOLN
200.0000 mg | Freq: Once | INTRAVENOUS | Status: AC
  Administered 2023-02-19: 200 mg via INTRAVENOUS
  Filled 2023-02-19: qty 200

## 2023-02-19 MED ORDER — SODIUM CHLORIDE 0.9 % IV SOLN: Freq: Once | INTRAVENOUS | Status: AC

## 2023-02-19 NOTE — Patient Instructions (Signed)
Iron-Rich Diet  Iron is a mineral that helps your body produce hemoglobin. Hemoglobin is a protein in red blood cells that carries oxygen to your body's tissues. Eating too little iron may cause you to feel weak and tired, and it can increase your risk of infection. Iron is naturally found in many foods, and many foods have iron added to them (are iron-fortified). You may need to follow an iron-rich diet if you do not have enough iron in your body due to certain medical conditions. The amount of iron that you need each day depends on your age, your sex, and any medical conditions you have. Follow instructions from your health care provider or a dietitian about how much iron you should eat each day. What are tips for following this plan? Reading food labels Check food labels to see how many milligrams (mg) of iron are in each serving. Cooking Cook foods in pots and pans that are made from iron. Take these steps to make it easier for your body to absorb iron from certain foods: Soak beans overnight before cooking. Soak whole grains overnight and drain them before using. Ferment flours before baking, such as by using yeast in bread dough. Meal planning When you eat foods that contain iron, you should eat them with foods that are high in vitamin C. These include oranges, peppers, tomatoes, potatoes, and mangoes. Vitamin C helps your body absorb iron. Certain foods and drinks prevent your body from absorbing iron properly. Avoid eating these foods in the same meal as iron-rich foods or with iron supplements. These foods include: Coffee, black tea, and red wine. Milk, dairy products, and foods that are high in calcium. Beans and soybeans. Whole grains. General information Take iron supplements only as told by your health care provider. An overdose of iron can be life-threatening. If you were prescribed iron supplements, take them with orange juice or a vitamin C supplement. When you eat  iron-fortified foods or take an iron supplement, you should also eat foods that naturally contain iron, such as meat, poultry, and fish. Eating naturally iron-rich foods helps your body absorb the iron that is added to other foods or contained in a supplement. Iron from animal sources is better absorbed than iron from plant sources. What foods should I eat? Fruits Prunes. Raisins. Eat fruits high in vitamin C, such as oranges, grapefruits, and strawberries, with iron-rich foods. Vegetables Spinach (cooked). Green peas. Broccoli. Fermented vegetables. Eat vegetables high in vitamin C, such as leafy greens, potatoes, bell peppers, and tomatoes, with iron-rich foods. Grains Iron-fortified breakfast cereal. Iron-fortified whole-wheat bread. Enriched rice. Sprouted grains. Meats and other proteins Beef liver. Beef. Turkey. Chicken. Oysters. Shrimp. Tuna. Sardines. Chickpeas. Nuts. Tofu. Pumpkin seeds. Beverages Tomato juice. Fresh orange juice. Prune juice. Hibiscus tea. Iron-fortified instant breakfast shakes. Sweets and desserts Blackstrap molasses. Seasonings and condiments Tahini. Fermented soy sauce. Other foods Wheat germ. The items listed above may not be a complete list of recommended foods and beverages. Contact a dietitian for more information. What foods should I limit? These are foods that should be limited while eating iron-rich foods as they can reduce the absorption of iron in your body. Grains Whole grains. Bran cereal. Bran flour. Meats and other proteins Soybeans. Products made from soy protein. Black beans. Lentils. Mung beans. Split peas. Dairy Milk. Cream. Cheese. Yogurt. Cottage cheese. Beverages Coffee. Black tea. Red wine. Sweets and desserts Cocoa. Chocolate. Ice cream. Seasonings and condiments Basil. Oregano. Large amounts of parsley. The items listed   above may not be a complete list of foods and beverages you should limit. Contact a dietitian for more  information. Summary Iron is a mineral that helps your body produce hemoglobin. Hemoglobin is a protein in red blood cells that carries oxygen to your body's tissues. Iron is naturally found in many foods, and many foods have iron added to them (are iron-fortified). When you eat foods that contain iron, you should eat them with foods that are high in vitamin C. Vitamin C helps your body absorb iron. Certain foods and drinks prevent your body from absorbing iron properly, such as whole grains and dairy products. You should avoid eating these foods in the same meal as iron-rich foods or with iron supplements. This information is not intended to replace advice given to you by your health care provider. Make sure you discuss any questions you have with your health care provider. Document Revised: 07/04/2020 Document Reviewed: 07/04/2020 Elsevier Patient Education  2024 Elsevier Inc. Iron Sucrose Injection What is this medication? IRON SUCROSE (EYE ern SOO krose) treats low levels of iron (iron deficiency anemia) in people with kidney disease. Iron is a mineral that plays an important role in making red blood cells, which carry oxygen from your lungs to the rest of your body. This medicine may be used for other purposes; ask your health care provider or pharmacist if you have questions. COMMON BRAND NAME(S): Venofer What should I tell my care team before I take this medication? They need to know if you have any of these conditions: Anemia not caused by low iron levels Heart disease High levels of iron in the blood Kidney disease Liver disease An unusual or allergic reaction to iron, other medications, foods, dyes, or preservatives Pregnant or trying to get pregnant Breastfeeding How should I use this medication? This medication is for infusion into a vein. It is given in a hospital or clinic setting. Talk to your care team about the use of this medication in children. While this medication may  be prescribed for children as young as 2 years for selected conditions, precautions do apply. Overdosage: If you think you have taken too much of this medicine contact a poison control center or emergency room at once. NOTE: This medicine is only for you. Do not share this medicine with others. What if I miss a dose? Keep appointments for follow-up doses. It is important not to miss your dose. Call your care team if you are unable to keep an appointment. What may interact with this medication? Do not take this medication with any of the following: Deferoxamine Dimercaprol Other iron products This medication may also interact with the following: Chloramphenicol Deferasirox This list may not describe all possible interactions. Give your health care provider a list of all the medicines, herbs, non-prescription drugs, or dietary supplements you use. Also tell them if you smoke, drink alcohol, or use illegal drugs. Some items may interact with your medicine. What should I watch for while using this medication? Visit your care team regularly. Tell your care team if your symptoms do not start to get better or if they get worse. You may need blood work done while you are taking this medication. You may need to follow a special diet. Talk to your care team. Foods that contain iron include: whole grains/cereals, dried fruits, beans, or peas, leafy green vegetables, and organ meats (liver, kidney). What side effects may I notice from receiving this medication? Side effects that you should report to your   care team as soon as possible: Allergic reactions--skin rash, itching, hives, swelling of the face, lips, tongue, or throat Low blood pressure--dizziness, feeling faint or lightheaded, blurry vision Shortness of breath Side effects that usually do not require medical attention (report to your care team if they continue or are bothersome): Flushing Headache Joint pain Muscle pain Nausea Pain, redness,  or irritation at injection site This list may not describe all possible side effects. Call your doctor for medical advice about side effects. You may report side effects to FDA at 1-800-FDA-1088. Where should I keep my medication? This medication is given in a hospital or clinic and will not be stored at home. NOTE: This sheet is a summary. It may not cover all possible information. If you have questions about this medicine, talk to your doctor, pharmacist, or health care provider.  2024 Elsevier/Gold Standard (2022-01-31 00:00:00)  

## 2023-02-20 ENCOUNTER — Ambulatory Visit: Payer: 59

## 2023-02-20 MED FILL — Iron Sucrose Inj 20 MG/ML (Fe Equiv): INTRAVENOUS | Qty: 10 | Status: AC

## 2023-02-20 NOTE — Progress Notes (Signed)
   Blood Pressure Recheck Visit  Name: Rebekah Barnes MRN: [id number] Date of Birth: 1981/07/01  Rebekah Barnes presents today for Blood Pressure recheck with clinical support staff.  Order for BP recheck by Langley Gauss, ordered on 02/06/23.   BP Readings from Last 3 Encounters:  02/20/23 120/78  02/19/23 (!) 122/55  02/15/23 115/66    Current Outpatient Medications  Medication Sig Dispense Refill   ALPRAZolam (XANAX) 0.25 MG tablet Take 1-2 tabs (0.25mg -0.50mg ) 30-60 minutes before procedure. May repeat if needed.Do not drive. 4 tablet 0   amLODipine (NORVASC) 5 MG tablet Take 1 tablet (5 mg total) by mouth daily. 90 tablet 0   ciprofloxacin (CILOXAN) 0.3 % ophthalmic solution Place 1 drop into the left eye in the morning, at noon, in the evening, and at bedtime.     hydrochlorothiazide (HYDRODIURIL) 25 MG tablet Take 1 tablet (25 mg total) by mouth daily. 90 tablet 0   metoprolol tartrate (LOPRESSOR) 50 MG tablet Take 1 tablet (50 mg total) by mouth daily. 90 tablet 0   Multiple Vitamin (MULTIVITAMIN) tablet Take 1 tablet by mouth daily.     trimethoprim-polymyxin b (POLYTRIM) ophthalmic solution Place 1 drop into both eyes 4 (four) times daily.     No current facility-administered medications for this visit.    Hypertensive Medication Review: Patient states that they are taking all their hypertensive medications as prescribed and their last dose of hypertensive medications was this morning   Documentation of any medication adherence discrepancies: none

## 2023-02-21 ENCOUNTER — Encounter: Payer: Self-pay | Admitting: Oncology

## 2023-02-21 ENCOUNTER — Inpatient Hospital Stay: Payer: 59

## 2023-02-21 VITALS — BP 117/72 | HR 71 | Temp 98.1°F | Resp 18 | Ht 64.0 in | Wt 275.0 lb

## 2023-02-21 DIAGNOSIS — D509 Iron deficiency anemia, unspecified: Secondary | ICD-10-CM

## 2023-02-21 MED ORDER — SODIUM CHLORIDE 0.9 % IV SOLN: Freq: Once | INTRAVENOUS | Status: AC

## 2023-02-21 MED ORDER — SODIUM CHLORIDE 0.9 % IV SOLN
200.0000 mg | Freq: Once | INTRAVENOUS | Status: AC
  Administered 2023-02-21: 200 mg via INTRAVENOUS
  Filled 2023-02-21: qty 200

## 2023-02-21 MED FILL — Iron Sucrose Inj 20 MG/ML (Fe Equiv): INTRAVENOUS | Qty: 10 | Status: AC

## 2023-02-21 NOTE — Patient Instructions (Signed)
Iron Sucrose Injection What is this medication? IRON SUCROSE (EYE ern SOO krose) treats low levels of iron (iron deficiency anemia) in people with kidney disease. Iron is a mineral that plays an important role in making red blood cells, which carry oxygen from your lungs to the rest of your body. This medicine may be used for other purposes; ask your health care provider or pharmacist if you have questions. COMMON BRAND NAME(S): Venofer What should I tell my care team before I take this medication? They need to know if you have any of these conditions: Anemia not caused by low iron levels Heart disease High levels of iron in the blood Kidney disease Liver disease An unusual or allergic reaction to iron, other medications, foods, dyes, or preservatives Pregnant or trying to get pregnant Breastfeeding How should I use this medication? This medication is for infusion into a vein. It is given in a hospital or clinic setting. Talk to your care team about the use of this medication in children. While this medication may be prescribed for children as young as 2 years for selected conditions, precautions do apply. Overdosage: If you think you have taken too much of this medicine contact a poison control center or emergency room at once. NOTE: This medicine is only for you. Do not share this medicine with others. What if I miss a dose? Keep appointments for follow-up doses. It is important not to miss your dose. Call your care team if you are unable to keep an appointment. What may interact with this medication? Do not take this medication with any of the following: Deferoxamine Dimercaprol Other iron products This medication may also interact with the following: Chloramphenicol Deferasirox This list may not describe all possible interactions. Give your health care provider a list of all the medicines, herbs, non-prescription drugs, or dietary supplements you use. Also tell them if you smoke,  drink alcohol, or use illegal drugs. Some items may interact with your medicine. What should I watch for while using this medication? Visit your care team regularly. Tell your care team if your symptoms do not start to get better or if they get worse. You may need blood work done while you are taking this medication. You may need to follow a special diet. Talk to your care team. Foods that contain iron include: whole grains/cereals, dried fruits, beans, or peas, leafy green vegetables, and organ meats (liver, kidney). What side effects may I notice from receiving this medication? Side effects that you should report to your care team as soon as possible: Allergic reactions--skin rash, itching, hives, swelling of the face, lips, tongue, or throat Low blood pressure--dizziness, feeling faint or lightheaded, blurry vision Shortness of breath Side effects that usually do not require medical attention (report to your care team if they continue or are bothersome): Flushing Headache Joint pain Muscle pain Nausea Pain, redness, or irritation at injection site This list may not describe all possible side effects. Call your doctor for medical advice about side effects. You may report side effects to FDA at 1-800-FDA-1088. Where should I keep my medication? This medication is given in a hospital or clinic and will not be stored at home. NOTE: This sheet is a summary. It may not cover all possible information. If you have questions about this medicine, talk to your doctor, pharmacist, or health care provider.  2024 Elsevier/Gold Standard (2022-01-31 00:00:00)  

## 2023-02-22 ENCOUNTER — Inpatient Hospital Stay: Payer: 59

## 2023-02-22 VITALS — BP 110/61 | HR 77 | Temp 98.0°F | Resp 18

## 2023-02-22 DIAGNOSIS — D509 Iron deficiency anemia, unspecified: Secondary | ICD-10-CM | POA: Diagnosis not present

## 2023-02-22 MED ORDER — SODIUM CHLORIDE 0.9 % IV SOLN: Freq: Once | INTRAVENOUS | Status: AC

## 2023-02-22 MED ORDER — SODIUM CHLORIDE 0.9 % IV SOLN
200.0000 mg | Freq: Once | INTRAVENOUS | Status: AC
  Administered 2023-02-22: 200 mg via INTRAVENOUS
  Filled 2023-02-22: qty 200

## 2023-02-22 NOTE — Patient Instructions (Signed)
Iron Sucrose Injection What is this medication? IRON SUCROSE (EYE ern SOO krose) treats low levels of iron (iron deficiency anemia) in people with kidney disease. Iron is a mineral that plays an important role in making red blood cells, which carry oxygen from your lungs to the rest of your body. This medicine may be used for other purposes; ask your health care provider or pharmacist if you have questions. COMMON BRAND NAME(S): Venofer What should I tell my care team before I take this medication? They need to know if you have any of these conditions: Anemia not caused by low iron levels Heart disease High levels of iron in the blood Kidney disease Liver disease An unusual or allergic reaction to iron, other medications, foods, dyes, or preservatives Pregnant or trying to get pregnant Breastfeeding How should I use this medication? This medication is for infusion into a vein. It is given in a hospital or clinic setting. Talk to your care team about the use of this medication in children. While this medication may be prescribed for children as young as 2 years for selected conditions, precautions do apply. Overdosage: If you think you have taken too much of this medicine contact a poison control center or emergency room at once. NOTE: This medicine is only for you. Do not share this medicine with others. What if I miss a dose? Keep appointments for follow-up doses. It is important not to miss your dose. Call your care team if you are unable to keep an appointment. What may interact with this medication? Do not take this medication with any of the following: Deferoxamine Dimercaprol Other iron products This medication may also interact with the following: Chloramphenicol Deferasirox This list may not describe all possible interactions. Give your health care provider a list of all the medicines, herbs, non-prescription drugs, or dietary supplements you use. Also tell them if you smoke,  drink alcohol, or use illegal drugs. Some items may interact with your medicine. What should I watch for while using this medication? Visit your care team regularly. Tell your care team if your symptoms do not start to get better or if they get worse. You may need blood work done while you are taking this medication. You may need to follow a special diet. Talk to your care team. Foods that contain iron include: whole grains/cereals, dried fruits, beans, or peas, leafy green vegetables, and organ meats (liver, kidney). What side effects may I notice from receiving this medication? Side effects that you should report to your care team as soon as possible: Allergic reactions--skin rash, itching, hives, swelling of the face, lips, tongue, or throat Low blood pressure--dizziness, feeling faint or lightheaded, blurry vision Shortness of breath Side effects that usually do not require medical attention (report to your care team if they continue or are bothersome): Flushing Headache Joint pain Muscle pain Nausea Pain, redness, or irritation at injection site This list may not describe all possible side effects. Call your doctor for medical advice about side effects. You may report side effects to FDA at 1-800-FDA-1088. Where should I keep my medication? This medication is given in a hospital or clinic. It will not be stored at home. NOTE: This sheet is a summary. It may not cover all possible information. If you have questions about this medicine, talk to your doctor, pharmacist, or health care provider.  2024 Elsevier/Gold Standard (2022-12-28 00:00:00)

## 2023-02-27 ENCOUNTER — Other Ambulatory Visit: Payer: Self-pay | Admitting: Family Medicine

## 2023-02-27 DIAGNOSIS — I10 Essential (primary) hypertension: Secondary | ICD-10-CM

## 2023-02-27 MED FILL — Iron Sucrose Inj 20 MG/ML (Fe Equiv): INTRAVENOUS | Qty: 10 | Status: AC

## 2023-02-28 ENCOUNTER — Inpatient Hospital Stay: Payer: 59

## 2023-02-28 ENCOUNTER — Other Ambulatory Visit: Payer: Self-pay

## 2023-02-28 VITALS — BP 122/63 | HR 61 | Temp 98.1°F | Resp 18 | Wt 270.5 lb

## 2023-02-28 DIAGNOSIS — D509 Iron deficiency anemia, unspecified: Secondary | ICD-10-CM

## 2023-02-28 DIAGNOSIS — I10 Essential (primary) hypertension: Secondary | ICD-10-CM

## 2023-02-28 MED ORDER — SODIUM CHLORIDE 0.9 % IV SOLN: Freq: Once | INTRAVENOUS | Status: AC

## 2023-02-28 MED ORDER — SODIUM CHLORIDE 0.9 % IV SOLN
200.0000 mg | Freq: Once | INTRAVENOUS | Status: AC
  Administered 2023-02-28: 200 mg via INTRAVENOUS
  Filled 2023-02-28: qty 200

## 2023-02-28 MED ORDER — HYDROCHLOROTHIAZIDE 25 MG PO TABS
25.0000 mg | ORAL_TABLET | Freq: Every day | ORAL | 1 refills | Status: DC
Start: 2023-02-28 — End: 2023-10-30

## 2023-02-28 NOTE — Patient Instructions (Signed)
Iron Sucrose Injection What is this medication? IRON SUCROSE (EYE ern SOO krose) treats low levels of iron (iron deficiency anemia) in people with kidney disease. Iron is a mineral that plays an important role in making red blood cells, which carry oxygen from your lungs to the rest of your body. This medicine may be used for other purposes; ask your health care provider or pharmacist if you have questions. COMMON BRAND NAME(S): Venofer What should I tell my care team before I take this medication? They need to know if you have any of these conditions: Anemia not caused by low iron levels Heart disease High levels of iron in the blood Kidney disease Liver disease An unusual or allergic reaction to iron, other medications, foods, dyes, or preservatives Pregnant or trying to get pregnant Breastfeeding How should I use this medication? This medication is for infusion into a vein. It is given in a hospital or clinic setting. Talk to your care team about the use of this medication in children. While this medication may be prescribed for children as young as 2 years for selected conditions, precautions do apply. Overdosage: If you think you have taken too much of this medicine contact a poison control center or emergency room at once. NOTE: This medicine is only for you. Do not share this medicine with others. What if I miss a dose? Keep appointments for follow-up doses. It is important not to miss your dose. Call your care team if you are unable to keep an appointment. What may interact with this medication? Do not take this medication with any of the following: Deferoxamine Dimercaprol Other iron products This medication may also interact with the following: Chloramphenicol Deferasirox This list may not describe all possible interactions. Give your health care provider a list of all the medicines, herbs, non-prescription drugs, or dietary supplements you use. Also tell them if you smoke,  drink alcohol, or use illegal drugs. Some items may interact with your medicine. What should I watch for while using this medication? Visit your care team regularly. Tell your care team if your symptoms do not start to get better or if they get worse. You may need blood work done while you are taking this medication. You may need to follow a special diet. Talk to your care team. Foods that contain iron include: whole grains/cereals, dried fruits, beans, or peas, leafy green vegetables, and organ meats (liver, kidney). What side effects may I notice from receiving this medication? Side effects that you should report to your care team as soon as possible: Allergic reactions--skin rash, itching, hives, swelling of the face, lips, tongue, or throat Low blood pressure--dizziness, feeling faint or lightheaded, blurry vision Shortness of breath Side effects that usually do not require medical attention (report to your care team if they continue or are bothersome): Flushing Headache Joint pain Muscle pain Nausea Pain, redness, or irritation at injection site This list may not describe all possible side effects. Call your doctor for medical advice about side effects. You may report side effects to FDA at 1-800-FDA-1088. Where should I keep my medication? This medication is given in a hospital or clinic. It will not be stored at home. NOTE: This sheet is a summary. It may not cover all possible information. If you have questions about this medicine, talk to your doctor, pharmacist, or health care provider.  2024 Elsevier/Gold Standard (2022-12-28 00:00:00)

## 2023-04-09 ENCOUNTER — Encounter: Payer: Self-pay | Admitting: Nurse Practitioner

## 2023-04-09 ENCOUNTER — Other Ambulatory Visit: Payer: Self-pay

## 2023-04-09 DIAGNOSIS — I10 Essential (primary) hypertension: Secondary | ICD-10-CM

## 2023-04-09 MED ORDER — AMLODIPINE BESYLATE 5 MG PO TABS
5.0000 mg | ORAL_TABLET | Freq: Every day | ORAL | 0 refills | Status: DC
Start: 2023-04-09 — End: 2023-08-06

## 2023-04-25 ENCOUNTER — Ambulatory Visit: Payer: 59 | Admitting: Neurology

## 2023-04-25 ENCOUNTER — Encounter (INDEPENDENT_AMBULATORY_CARE_PROVIDER_SITE_OTHER): Payer: Self-pay

## 2023-05-14 ENCOUNTER — Ambulatory Visit: Payer: 59 | Admitting: Physician Assistant

## 2023-05-16 ENCOUNTER — Other Ambulatory Visit: Payer: Self-pay | Admitting: Oncology

## 2023-05-16 DIAGNOSIS — D509 Iron deficiency anemia, unspecified: Secondary | ICD-10-CM

## 2023-05-16 NOTE — Progress Notes (Deleted)
[facility]  [address] [place],  [place]  [address] [phone]  Clinic Day:  05/16/2023  Referring physician: Langley Gauss, PA   HISTORY OF PRESENT ILLNESS:  The patient is a 42 y.o. female who I was asked to re-evaluate for anemia.  Labs at her primary care office last week showed a low hemoglobin of 7.5.  Furthermore, her iron parameters were strikingly low.  She claims her menstrual cycles have been heavy, lasting 6-7 days at a time.  Many of those days are particularly heavy.  She has not had a Pap smear/pelvic exam in years.  She denies having other overt forms of blood loss.  Also she feels tired, it is not to the point where she feels she needs a blood transfusion.  PHYSICAL EXAM:  There were no vitals taken for this visit. Wt Readings from Last 3 Encounters:  02/28/23 270 lb 8 oz (122.7 kg)  02/21/23 275 lb 0.6 oz (124.8 kg)  02/19/23 271 lb 0.6 oz (122.9 kg)   There is no height or weight on file to calculate BMI. Performance status (ECOG): 0 - Asymptomatic Physical Exam  LABS:      Latest Ref Rng & Units 02/06/2023    9:35 AM 03/30/2022    8:11 AM 12/08/2021    8:18 AM  CBC  WBC 3.4 - 10.8 x10E3/uL 12.7  14.9  13.2   Hemoglobin 11.1 - 15.9 g/dL 7.3  91.4  78.2   Hematocrit 34.0 - 46.6 % 25.8  32.8  35.8   Platelets 150 - 450 x10E3/uL 554  454  408       Latest Ref Rng & Units 02/06/2023    9:35 AM 03/30/2022    8:11 AM 12/08/2021    8:18 AM  CMP  Glucose 70 - 99 mg/dL 55  96  91   BUN 6 - 24 mg/dL 14  9  13    Creatinine 0.57 - 1.00 mg/dL 9.56  2.13  0.86   Sodium 134 - 144 mmol/L 141  138  138   Potassium 3.5 - 5.2 mmol/L 4.4  4.1  4.5   Chloride 96 - 106 mmol/L 99  101  102   CO2 20 - 29 mmol/L 20  23  24    Calcium 8.7 - 10.2 mg/dL 9.3  9.3  9.4   Total Protein 6.0 - 8.5 g/dL 7.3  7.3  7.2   Total Bilirubin 0.0 - 1.2 mg/dL 0.3  0.3  0.4   Alkaline Phos 44 - 121 IU/L 88  85  90   AST 0 - 40 IU/L 16  17  17    ALT 0 - 32  IU/L 10  10  14      Latest Reference Range & Units 02/06/23 09:35  Iron 27 - 159 ug/dL 20 (L)  UIBC 578 - 469 ug/dL 629  TIBC 528 - 413 ug/dL 244  Ferritin 15 - 010 ng/mL 10 (L)  Iron Saturation 15 - 55 % 5 (LL)  (LL): Data is critically low (L): Data is abnormally low  ASSESSMENT & PLAN:  Assessment/Plan:  A 42 y.o. female whose labs show significant iron deficiency anemia, likely related to her heavy menstrual cycles.  I will arrange for her to receive IV iron within the forthcoming days to rapidly replenish her iron stores and improve her hemoglobin.  She knows she needs to see her gynecologist to ensure there are no ominous issues behind her heavy menstrual cycles.  Otherwise, I will see her back in 3 months to reassess her labs and see how well she responded to her upcoming IV iron.  The patient understands all the plans discussed today and is in agreement with them.    Leyan Branden Kirby Funk, MD

## 2023-05-17 ENCOUNTER — Inpatient Hospital Stay: Payer: 59

## 2023-05-17 ENCOUNTER — Inpatient Hospital Stay: Payer: 59 | Admitting: Oncology

## 2023-05-17 ENCOUNTER — Telehealth: Payer: Self-pay | Admitting: Oncology

## 2023-05-17 NOTE — Telephone Encounter (Signed)
05/17/23 Patient left message to cancel appts.

## 2023-06-04 ENCOUNTER — Other Ambulatory Visit: Payer: Self-pay | Admitting: Family Medicine

## 2023-06-04 DIAGNOSIS — I10 Essential (primary) hypertension: Secondary | ICD-10-CM

## 2023-08-06 ENCOUNTER — Other Ambulatory Visit: Payer: Self-pay | Admitting: Physician Assistant

## 2023-08-06 DIAGNOSIS — I10 Essential (primary) hypertension: Secondary | ICD-10-CM

## 2023-08-24 IMAGING — MG MM DIGITAL SCREENING BILAT W/ TOMO AND CAD
8 series · 8 of 24 positions shown · non-contrast
Comparison: None.

ACR Breast Density Category a: The breast tissue is almost entirely
fatty.

CLINICAL DATA: Screening.

EXAM:
DIGITAL SCREENING BILATERAL MAMMOGRAM WITH TOMOSYNTHESIS AND CAD
TECHNIQUE: Bilateral screening digital craniocaudal and mediolateral oblique
mammograms were obtained. Bilateral screening digital breast
tomosynthesis was performed. The images were evaluated with
computer-aided detection.

[L CC synth-2D]
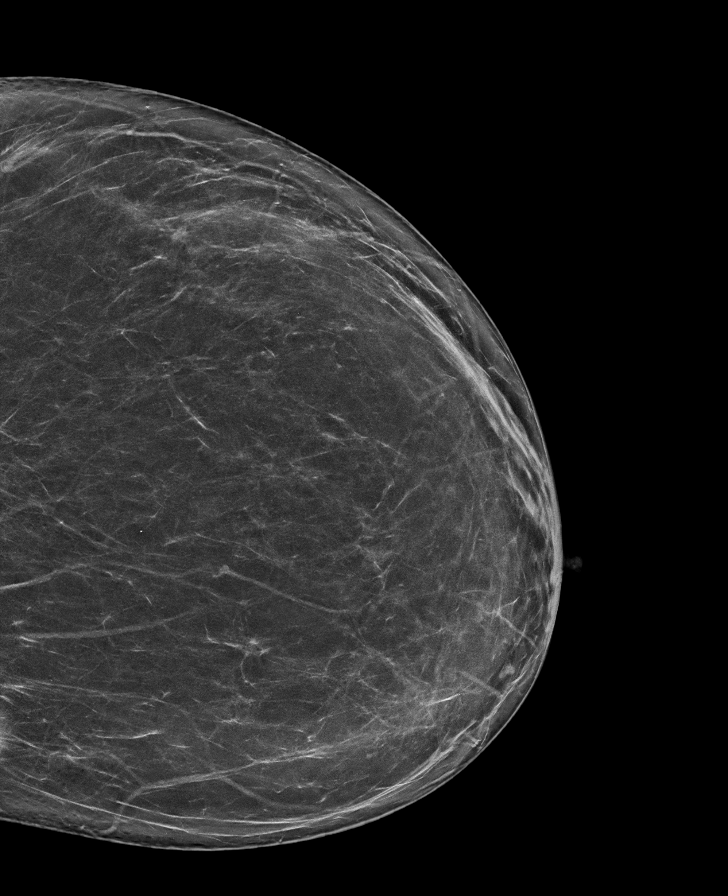

[R CC synth-2D]
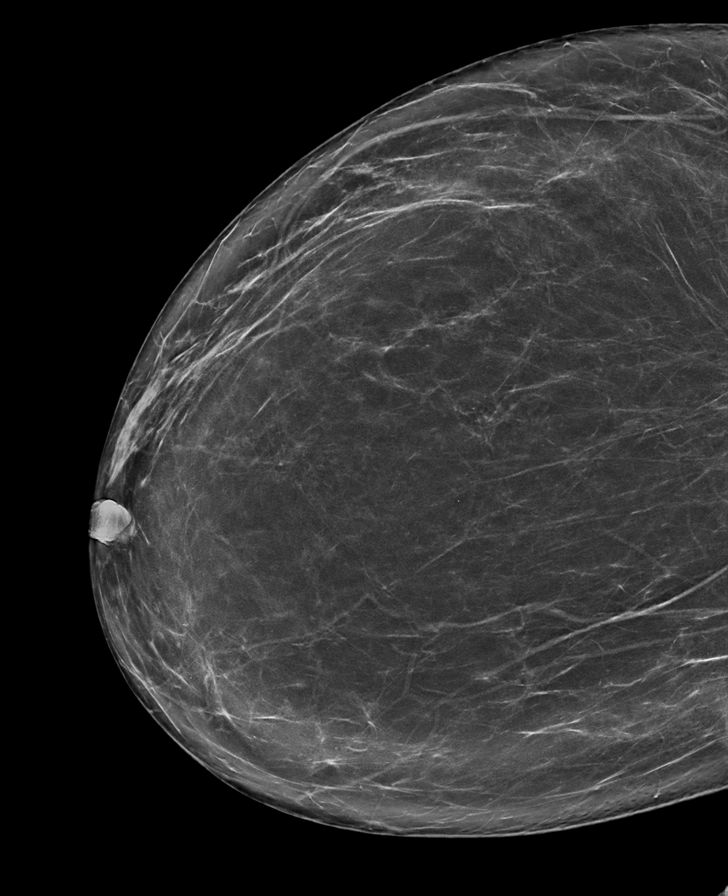

[R MLO synth-2D]
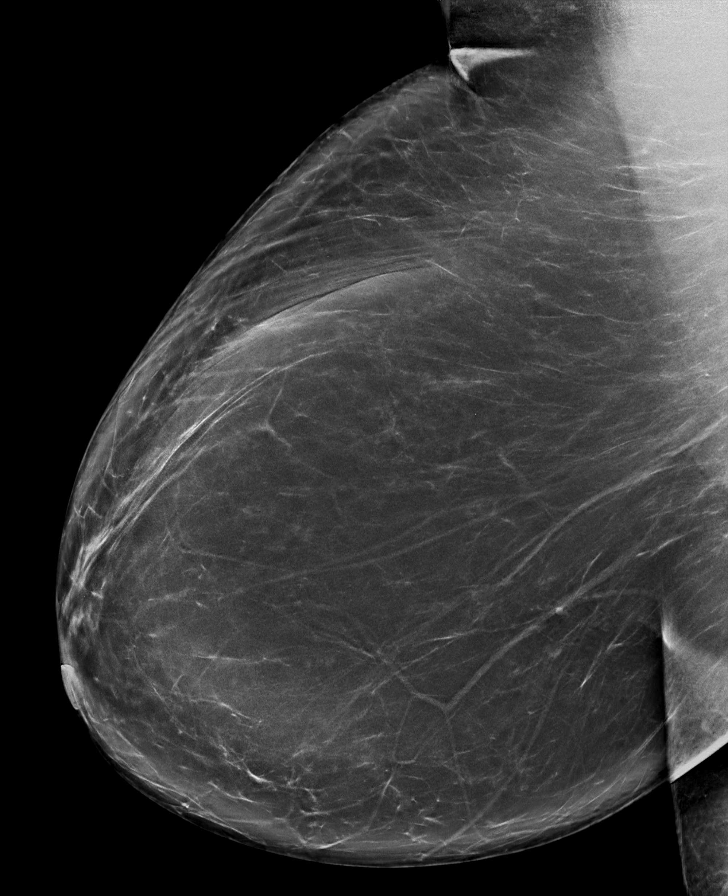

[L MLO synth-2D]
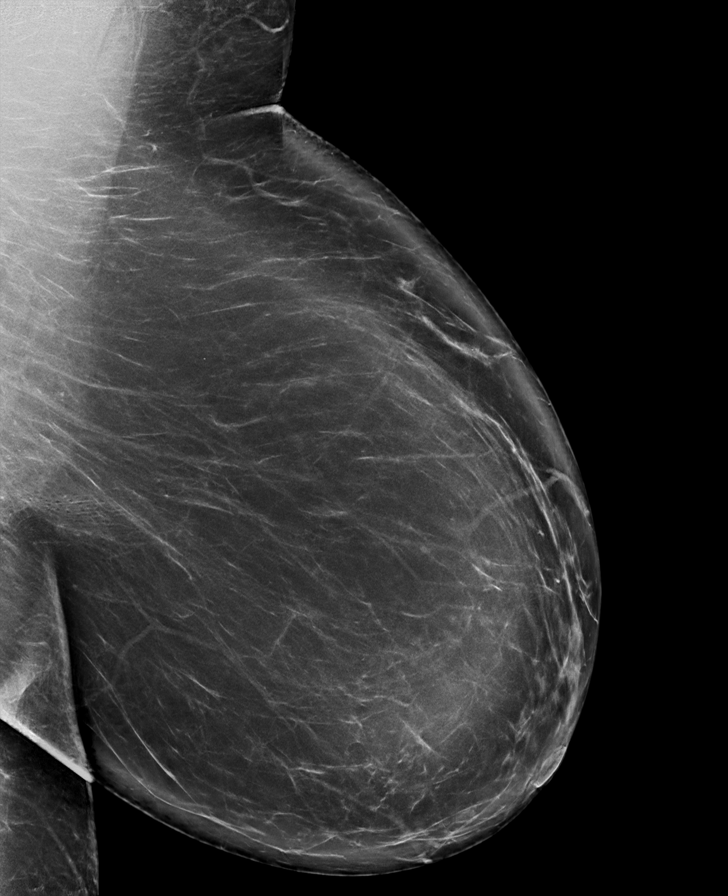

[R MLO tomo · tomo slice 53/105.0]
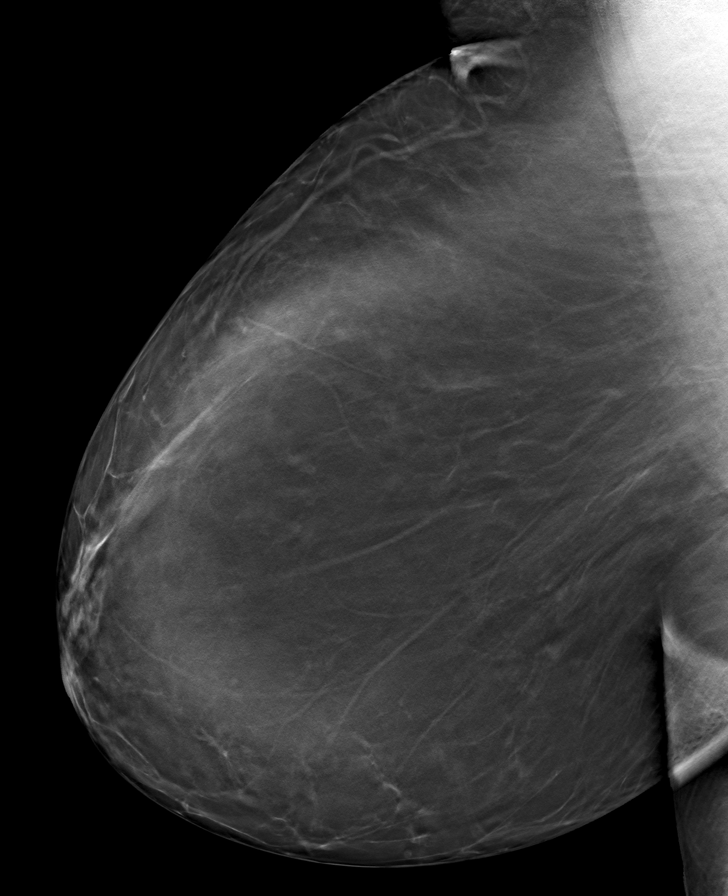

[R CC tomo · tomo slice 45/88.0]
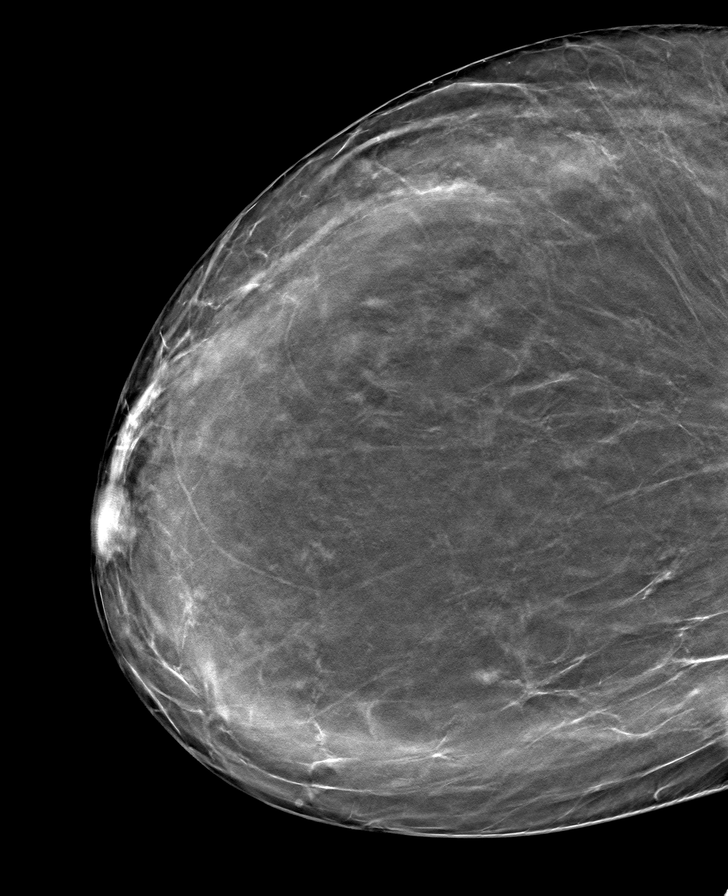

[L CC tomo · tomo slice 41/81.0]
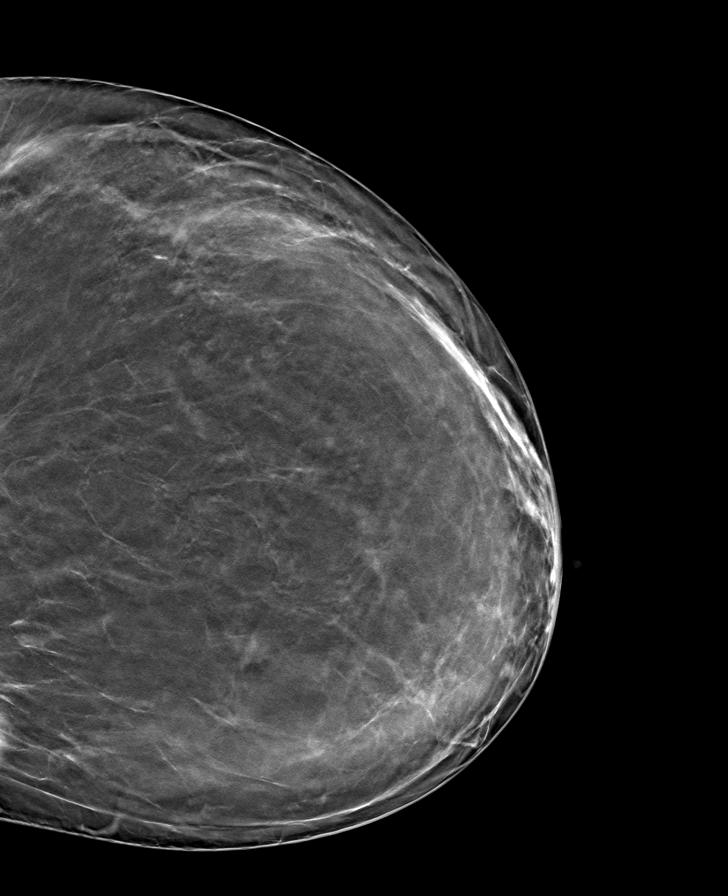

[L MLO tomo · tomo slice 55/108.0]
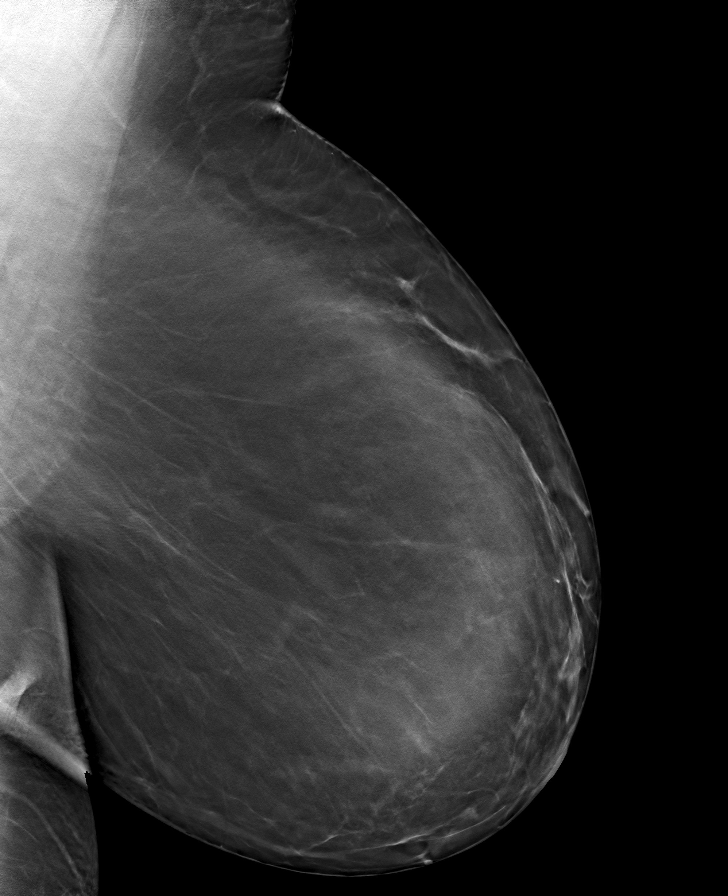

[8 of 24 positions shown; findings below may reference images not displayed]

FINDINGS: In the left breast, a possible asymmetry warrants further
evaluation. In the right breast, no findings suspicious for
malignancy.
IMPRESSION: Further evaluation is suggested for possible asymmetry in the left
breast.

RECOMMENDATION:
Diagnostic mammogram and possibly ultrasound of the left breast.
(Code:[id number])

The patient will be contacted regarding the findings, and additional
imaging will be scheduled.

BI-RADS CATEGORY  0: Incomplete. Need additional imaging evaluation
and/or prior mammograms for comparison.

## 2023-10-13 ENCOUNTER — Emergency Department (HOSPITAL_COMMUNITY)
Admission: EM | Admit: 2023-10-13 | Discharge: 2023-10-13 | Disposition: A | Discharge status: Home / Self Care | Payer: Self-pay | Attending: Emergency Medicine | Admitting: Emergency Medicine

## 2023-10-13 DIAGNOSIS — I10 Essential (primary) hypertension: Secondary | ICD-10-CM | POA: Insufficient documentation

## 2023-10-13 DIAGNOSIS — Z79899 Other long term (current) drug therapy: Secondary | ICD-10-CM | POA: Insufficient documentation

## 2023-10-13 DIAGNOSIS — F10920 Alcohol use, unspecified with intoxication, uncomplicated: Secondary | ICD-10-CM | POA: Insufficient documentation

## 2023-10-13 MED ORDER — SODIUM CHLORIDE 0.9 % IV BOLUS
1000.0000 mL | Freq: Once | INTRAVENOUS | Status: AC
  Administered 2023-10-13: 1000 mL via INTRAVENOUS

## 2023-10-13 NOTE — ED Provider Notes (Signed)
 [place] EMERGENCY DEPARTMENT AT [facility] Provider Note  CSN: [id number] Arrival date & time: 10/13/23 0150  Chief Complaint(s) Alcohol Intoxication  HPI Rebekah Barnes is a 43 y.o. female with a past medical history listed below who is brought in by EMS for alcohol intoxication.  EMS was called out to a local L [facility] for intoxication.  Patient reportedly drank large amounts of alcohol this evening and had several bouts of nonbloody nonbilious emesis before passing out.  She was there with friends who reported that patient had not had any drugs.  She did have a few bouts of emesis with EMS and was given IV fluids and antiemetics.  She has remained hemodynamically stable.  Response to painful stimuli.  The history is provided by the patient.    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 Authorizing Provider  ALPRAZolam (XANAX) 0.25 MG tablet Take 1-2 tabs (0.25mg -0.50mg ) 30-60  minutes before procedure. May repeat if needed.Do not drive. 02/11/23   Anson Fret, MD  amLODipine (NORVASC) 5 MG tablet Take 1 tablet by mouth once daily 08/06/23   Langley Gauss, PA  ciprofloxacin (CILOXAN) 0.3 % ophthalmic solution Place 1 drop into the left eye in the morning, at noon, in the evening, and at bedtime. 02/06/23   [provider]  hydrochlorothiazide (HYDRODIURIL) 25 MG tablet Take 1 tablet (25 mg total) by mouth daily. 02/28/23   Langley Gauss, PA  metoprolol tartrate (LOPRESSOR) 50 MG tablet Take 1 tablet by mouth once daily 06/04/23   Langley Gauss, PA  Multiple Vitamin (MULTIVITAMIN) tablet Take 1 tablet by mouth daily.    [provider]  trimethoprim-polymyxin b (POLYTRIM) ophthalmic solution Place 1 drop into both eyes 4 (four) times daily. 02/06/23   [provider]  Allergies Lisinopril  Review of Systems Review of Systems As noted in HPI  Physical Exam Vital Signs  I have reviewed the triage vital signs BP (!) 144/82   Pulse 67   Temp 98.6 F (37 C) (Axillary)   Resp 18   SpO2 100%   Physical Exam Vitals reviewed.  Constitutional:      General: She is not in acute distress.    Appearance: She is well-developed. She is not diaphoretic.  HENT:     Head: Normocephalic and atraumatic.     Nose: Nose normal.  Eyes:     General: No scleral icterus.       Right eye: No discharge.        Left eye: No discharge.     Conjunctiva/sclera: Conjunctivae normal.     Pupils: Pupils are equal, round, and reactive to light.  Cardiovascular:     Rate and Rhythm: Normal rate and regular rhythm.     Heart sounds: No murmur heard.    No friction rub. No gallop.  Pulmonary:     Effort: Pulmonary effort is normal. No respiratory distress.     Breath sounds: Normal breath sounds. No stridor. No rales.  Abdominal:      General: There is no distension.     Palpations: Abdomen is soft.     Tenderness: There is no abdominal tenderness.  Musculoskeletal:        General: No tenderness.     Cervical back: Normal range of motion and neck supple.  Skin:    General: Skin is warm and dry.     Findings: No erythema or rash.  Neurological:     GCS: GCS eye subscore is 3. GCS verbal subscore is 2. GCS motor subscore is 5.     Comments: Intoxicated.      ED Results and Treatments Labs (all labs ordered are listed, but only abnormal results are displayed) Labs Reviewed - No data to display                                                                                                                       EKG  EKG Interpretation Date/Time:    Ventricular Rate:    PR Interval:    QRS Duration:    QT Interval:    QTC Calculation:   R Axis:      Text Interpretation:         Radiology No results found.  Medications Ordered in ED Medications  sodium chloride 0.9 % bolus 1,000 mL (0 mLs Intravenous Stopped 10/13/23 0423)   Procedures Procedures  (including critical care time) Medical Decision Making / ED Course   Medical Decision Making   Intoxicated patient. No evidence of trauma on exam.  Patient provided with IV fluids and Dilaudid for metabolize to freedom.    Patient has been up and about, sobering up.  7:11 AM Patient is much more alert.  More coherent.  Patient will call for a ride home.  Final Clinical Impression(s) / ED Diagnoses Final diagnoses:  Alcoholic intoxication without complication (HCC)   The patient appears reasonably screened and/or stabilized for discharge and I doubt any other medical condition or other [facility] requiring further screening, evaluation, or treatment in the ED at this time. I have discussed the findings, Dx and Tx plan with the patient/family who expressed understanding and agree(s) with the plan. Discharge instructions discussed at length. The  patient/family was given strict return precautions who verbalized understanding of the instructions. No further questions at time of discharge.  Disposition: Discharge  Condition: Good  ED Discharge Orders     None        Follow Up: Langley Gauss, PA [address] [place] [address] [phone]  Call  to schedule an appointment for close follow up    This chart was dictated using voice recognition software.  Despite best efforts to proofread,  errors can occur which can change the documentation meaning.    Nira Conn, MD 10/13/23 [phone]

## 2023-10-13 NOTE — ED Triage Notes (Signed)
 [facility]. Pt friends reports pt had copious amounts of alcohol tonight. Pt friends denies pt hav\ing a fall and reports she was vomiting before passing out. Pt friends denies drug use. Pt sleeping upon arrival 148/86 15RR 81HR 99% RA 129 [facility] EMS administered 4mg  zofran with saline

## 2023-10-14 ENCOUNTER — Encounter: Payer: Self-pay | Admitting: Oncology

## 2023-10-30 ENCOUNTER — Other Ambulatory Visit: Payer: Self-pay | Admitting: Physician Assistant

## 2023-10-30 DIAGNOSIS — I10 Essential (primary) hypertension: Secondary | ICD-10-CM

## 2024-02-24 ENCOUNTER — Other Ambulatory Visit: Payer: Self-pay | Admitting: Physician Assistant

## 2024-02-24 DIAGNOSIS — I10 Essential (primary) hypertension: Secondary | ICD-10-CM

## 2024-07-22 ENCOUNTER — Other Ambulatory Visit: Payer: Self-pay | Admitting: Physician Assistant

## 2024-07-22 DIAGNOSIS — I10 Essential (primary) hypertension: Secondary | ICD-10-CM

## 2024-08-05 ENCOUNTER — Other Ambulatory Visit: Payer: Self-pay | Admitting: Physician Assistant

## 2024-08-05 DIAGNOSIS — I10 Essential (primary) hypertension: Secondary | ICD-10-CM

## 2024-08-22 ENCOUNTER — Other Ambulatory Visit: Payer: Self-pay | Admitting: Physician Assistant

## 2024-08-22 DIAGNOSIS — I10 Essential (primary) hypertension: Secondary | ICD-10-CM

## 2024-08-26 ENCOUNTER — Other Ambulatory Visit: Payer: Self-pay | Admitting: Physician Assistant

## 2024-08-26 DIAGNOSIS — I10 Essential (primary) hypertension: Secondary | ICD-10-CM

## 2024-09-02 ENCOUNTER — Other Ambulatory Visit: Payer: Self-pay | Admitting: Physician Assistant

## 2024-09-02 DIAGNOSIS — I10 Essential (primary) hypertension: Secondary | ICD-10-CM
